# Patient Record
Sex: Female | Born: 1945 | Race: Black or African American | Hispanic: No | Marital: Married | Smoking: Never smoker
Health system: Southern US, Community
[De-identification: ages and names within clinical notes are randomized; demographics above are authoritative.]

## PROBLEM LIST (undated history)

## (undated) DIAGNOSIS — F329 Major depressive disorder, single episode, unspecified: Secondary | ICD-10-CM

## (undated) DIAGNOSIS — F32A Depression, unspecified: Secondary | ICD-10-CM

## (undated) DIAGNOSIS — I639 Cerebral infarction, unspecified: Secondary | ICD-10-CM

## (undated) DIAGNOSIS — I10 Essential (primary) hypertension: Secondary | ICD-10-CM

## (undated) DIAGNOSIS — R040 Epistaxis: Secondary | ICD-10-CM

## (undated) HISTORY — PX: ROTATOR CUFF REPAIR: SHX139

---

## 1980-01-21 HISTORY — PX: OTHER SURGICAL HISTORY: SHX169

## 1989-01-20 HISTORY — PX: ABDOMINAL HYSTERECTOMY: SHX81

## 1996-01-21 HISTORY — PX: INTESTINAL BYPASS: SHX1099

## 1998-04-03 ENCOUNTER — Encounter (HOSPITAL_BASED_OUTPATIENT_CLINIC_OR_DEPARTMENT_OTHER): Payer: Self-pay | Admitting: Internal Medicine

## 1998-04-03 ENCOUNTER — Ambulatory Visit (HOSPITAL_COMMUNITY): Admission: RE | Admit: 1998-04-03 | Discharge: 1998-04-03 | Payer: Self-pay | Admitting: Internal Medicine

## 1999-03-14 ENCOUNTER — Encounter: Payer: Self-pay | Admitting: Internal Medicine

## 1999-03-14 ENCOUNTER — Encounter: Admission: RE | Admit: 1999-03-14 | Discharge: 1999-03-14 | Payer: Self-pay | Admitting: Internal Medicine

## 1999-11-05 ENCOUNTER — Encounter: Payer: Self-pay | Admitting: Emergency Medicine

## 1999-11-05 ENCOUNTER — Emergency Department (HOSPITAL_COMMUNITY): Admission: EM | Admit: 1999-11-05 | Discharge: 1999-11-05 | Payer: Self-pay | Admitting: Emergency Medicine

## 2000-03-06 ENCOUNTER — Inpatient Hospital Stay (HOSPITAL_COMMUNITY): Admission: EM | Admit: 2000-03-06 | Discharge: 2000-03-07 | Payer: Self-pay | Admitting: Emergency Medicine

## 2000-03-06 ENCOUNTER — Encounter: Payer: Self-pay | Admitting: Emergency Medicine

## 2001-04-13 ENCOUNTER — Encounter: Payer: Self-pay | Admitting: Specialist

## 2001-04-15 ENCOUNTER — Observation Stay (HOSPITAL_COMMUNITY): Admission: RE | Admit: 2001-04-15 | Discharge: 2001-04-16 | Payer: Self-pay | Admitting: Specialist

## 2002-10-29 ENCOUNTER — Emergency Department (HOSPITAL_COMMUNITY): Admission: EM | Admit: 2002-10-29 | Discharge: 2002-10-29 | Payer: Self-pay

## 2003-02-07 ENCOUNTER — Encounter (INDEPENDENT_AMBULATORY_CARE_PROVIDER_SITE_OTHER): Payer: Self-pay | Admitting: *Deleted

## 2003-02-07 ENCOUNTER — Ambulatory Visit (HOSPITAL_COMMUNITY): Admission: RE | Admit: 2003-02-07 | Discharge: 2003-02-07 | Payer: Self-pay | Admitting: *Deleted

## 2003-09-05 ENCOUNTER — Emergency Department (HOSPITAL_COMMUNITY): Admission: EM | Admit: 2003-09-05 | Discharge: 2003-09-05 | Payer: Self-pay | Admitting: Emergency Medicine

## 2004-01-21 HISTORY — PX: BACK SURGERY: SHX140

## 2004-08-01 ENCOUNTER — Encounter (INDEPENDENT_AMBULATORY_CARE_PROVIDER_SITE_OTHER): Payer: Self-pay | Admitting: Specialist

## 2004-08-01 ENCOUNTER — Inpatient Hospital Stay (HOSPITAL_COMMUNITY): Admission: RE | Admit: 2004-08-01 | Discharge: 2004-08-04 | Payer: Self-pay | Admitting: Specialist

## 2005-02-07 ENCOUNTER — Encounter: Admission: RE | Admit: 2005-02-07 | Discharge: 2005-02-07 | Payer: Self-pay | Admitting: Internal Medicine

## 2005-03-17 ENCOUNTER — Emergency Department (HOSPITAL_COMMUNITY): Admission: EM | Admit: 2005-03-17 | Discharge: 2005-03-17 | Payer: Self-pay

## 2005-12-15 ENCOUNTER — Ambulatory Visit: Admission: RE | Admit: 2005-12-15 | Discharge: 2005-12-15 | Payer: Self-pay | Admitting: Internal Medicine

## 2007-01-22 ENCOUNTER — Emergency Department (HOSPITAL_COMMUNITY): Admission: EM | Admit: 2007-01-22 | Discharge: 2007-01-23 | Payer: Self-pay | Admitting: Emergency Medicine

## 2007-10-19 ENCOUNTER — Inpatient Hospital Stay (HOSPITAL_COMMUNITY): Admission: EM | Admit: 2007-10-19 | Discharge: 2007-10-22 | Payer: Self-pay | Admitting: Emergency Medicine

## 2007-10-21 ENCOUNTER — Encounter (INDEPENDENT_AMBULATORY_CARE_PROVIDER_SITE_OTHER): Payer: Self-pay | Admitting: General Surgery

## 2008-09-08 ENCOUNTER — Ambulatory Visit (HOSPITAL_COMMUNITY): Admission: RE | Admit: 2008-09-08 | Discharge: 2008-09-09 | Payer: Self-pay | Admitting: Specialist

## 2010-04-27 LAB — BASIC METABOLIC PANEL
CO2: 28 mEq/L (ref 19–32)
Creatinine, Ser: 0.71 mg/dL (ref 0.4–1.2)
GFR calc Af Amer: >60 mL/min (ref >60)
GFR calc non Af Amer: >60 mL/min (ref >60)
Potassium: 4.5 mEq/L (ref 3.5–5.1)
Sodium: 138 mEq/L (ref 135–145)

## 2010-04-27 LAB — CBC
HCT: 36.6 % (ref 36.0–46.0)
MCHC: 31.7 g/dL (ref 30.0–36.0)
MCV: 77.3 fL — ABNORMAL LOW (ref 78.0–100.0)
RBC: 4.74 MIL/uL (ref 3.87–5.11)
RDW: 13.6 % (ref 11.5–15.5)
WBC: 5.3 10*3/uL (ref 4.0–10.5)

## 2010-06-04 NOTE — H&P (Signed)
Sherri Torres, Sherri Torres NO.:  [id number]   MEDICAL RECORD NO.:  [id number]          PATIENT TYPE:  EMS   LOCATION:  ED                           FACILITY:  [facility]   PHYSICIAN:  Angelia Mould. Derrell Lolling, M.D.DATE OF BIRTH:  08-Nov-1945   DATE OF ADMISSION:  10/19/2007  DATE OF DISCHARGE:                              HISTORY & PHYSICAL   CHIEF COMPLAINT:  Abdominal pain and nausea.   HISTORY OF PRESENT ILLNESS:  This is a 65 year old black female who  presented to the emergency room stating that she awoke at 3:00 a.m.  today with moderately severe upper abdominal pain which was steady in  character, did not radiate to the back.  She had nausea and vomiting x2.  She finally came to the emergency room for evaluation.  She says the  pain is essentially gone now.  She has had no prior similar episodes,  but she has been evaluated for chronic constipation and reflux by Dr.  Sabino Gasser.   While in the emergency room, she had a gallbladder ultrasound which  shows multiple gallstones up to 2 cm in diameter and one gallstone  within the gallbladder neck and a thickened gallbladder wall.  Dr.  Weldon Inches called me to see the patient.   PAST HISTORY:  1. Gravida 4, para 5, abortus 0.  2. Hypertension.  3. Constipation.  4. Abdominal hysterectomy in 1991, by Dr. Greta Doom.  5. Laparotomy for small bowel obstruction in 1993, by Dr. Daphine Deutscher.  6. Depression.  7. TIA.  8. Bilateral rotator cuff surgery.  9. Back surgery in 2006.   CURRENT MEDICATIONS:  1. Effexor XR 150 a day.  2. Benicar 40/25 daily.  3. Multivitamins.  4. Tylenol.   DRUG ALLERGIES:  DILANTIN.   SOCIAL HISTORY:  The patient is married.  Her husband works at the post  office.  She is retired from [employer] and [employer].  She denies the use of  alcohol or tobacco.  She had five children, but she lost one to an auto  pedestrian accident, two children were lost to AIDS and one was lost to  multiple myeloma.  She  attributes this to her depression.   FAMILY HISTORY:  Mother died of ovarian cancer.  Father died of throat  cancer.   REVIEW OF SYSTEMS:  A 16 system review of systems is performed and is  noncontributory except as described above.   PHYSICAL EXAMINATION:  GENERAL:  A pleasant, cooperative black female in  minimal distress.  VITAL SIGNS:  Temperature 99.3, blood pressure 170/85, repeat 134/77,  pulse 69, respirations 20.  HEENT:  Eyes; sclerae clear.  Extraocular  movements intact.  Ears, nose, mouth, throat, nose, lips, tongue and  oropharynx are without gross lesions.  NECK:  Supple, nontender.  No mass.  No jugulovenous distention.  LUNGS:  Clear to auscultation.  No costovertebral angle tenderness.  HEART:  Regular rate and rhythm.  She does have a grade 2/6 systolic  murmur and perhaps a split S2.  BREASTS:  Not examined.  ABDOMEN:  Soft.  Mild tenderness to  deep palpation in the right upper  quadrant.  No mass.  Liver and spleen not enlarged.  Lower midline scar  well healed.  Abdomen nontender elsewhere.  EXTREMITIES:  She moves all  four extremities well without pain or deformity.  NEUROLOGIC:  No gross motor or sensory deficits.   ADMISSION DATA:  Ultrasound as described above documenting gallbladder  wall thickening and gallstones.  White blood cell count 11,000,  hemoglobin 11.8.  Liver function tests normal.  Lipase 17.   ASSESSMENT:  1. Acute cholecystitis with cholelithiasis.  She probably has a      resolving attack at this time.  2. Hypertension.  3. Status post total abdominal hysterectomy.  4. Status post laparotomy for small-bowel obstruction.  5. Depression.  6. History of transient ischemic attack.   PLAN:  1. The patient wanted to be admitted and have her gallbladder surgery      at this time, so we are going to admit her, start her on IV      antibiotics, and plan a laparoscopic cholecystectomy with      cholangiogram in the next 24-48 hours.  2. I  have discussed the indication in details of surgery with her.      The risks and complications have been outlined, including, but not      limited to bleeding, infection, conversion to open laparotomy,      injury to adjacent structures, such as the main bile duct or      intestine with major reconstructive surgery, wound problems such as      infection or hernia, bile leak, cardiac, pulmonary and      thromboembolic problems.  She seems to understand these issues      well.  At this time, all of her questions were answered.  She is in      full agreement with this plan.      Angelia Mould. Derrell Lolling, M.D.  Electronically Signed     HMI/MEDQ  D:  10/19/2007  T:  10/20/2007  Job:  [id number]   cc:   Soyla Murphy. Renne Crigler, M.D.  Fax: [phone]

## 2010-06-04 NOTE — Op Note (Signed)
NAMEAAMNA, MALLOZZI NO.:  [id number]   MEDICAL RECORD NO.:  [id number]          PATIENT TYPE:  INP   LOCATION:  1338                         FACILITY:  [facility]   PHYSICIAN:  Adolph Pollack, M.D.DATE OF BIRTH:  11-12-1945   DATE OF PROCEDURE:  10/21/2007  DATE OF DISCHARGE:                               OPERATIVE REPORT   PREOPERATIVE DIAGNOSIS:  Acute cholecystitis.   POSTOPERATIVE DIAGNOSIS:  Acute cholecystitis.   PROCEDURE:  Laparoscopic cholecystectomy, intraoperative cholangiogram.   SURGEON:  Adolph Pollack, M.D.   ASSISTANT:  Darnell Level, M.D.   ANESTHESIA:  General.   INDICATIONS:  This 65 year old female was admitted very late October 19, 2007, with acute cholecystitis.  She had been hydrated, placed on  antibiotics, and now brought to the operating room for laparoscopic  possible open cholecystectomy.  The procedure risks were discussed with  her preoperatively.   TECHNIQUE:  She was brought to the operating room, placed supine on the  operating room table, and general anesthetic was administered.  The  abdominal wall was sterilely prepped and draped.  She had a previous  lower midline incision.  Marcaine was infiltrated in the supraumbilical  area.  A supraumbilical small transverse incision was made through the  skin and subcutaneous tissue exposing the midline fascia.  A small  incision was made in the midline fascia, and the peritoneal cavity was  entered under direct vision.  A pursestring suture of 0 Vicryl placed  around the fascial edges.  A Hassan trocar was introduced into the  peritoneal cavity and pneumoperitoneum created by insufflation of CO2  gas.   Following this, the laparoscope was introduced.  She was placed in  reversed Trendelenburg position, the right side tilted slightly up.  An  11-mm trocar was placed through an epigastric incision and two 5-mm  trocars placed in right upper quadrant.  The gallbladder was  acutely  inflamed and edematous with some very thin adhesions between the omentum  and gallbladder which were taken down easily.  Needle decompression was  then performed of the gallbladder, evacuating dark fluid.  The fundus  was then of the gallbladder was then grasped and retracted toward the  right shoulder.  The infundibulum was retracted laterally and mobilized  using blunt dissection.  I then identified the cystic duct and an  anterior branch of the cystic artery running anterior to the cystic  duct.  I created a window around the anterior branch of the cystic  artery, and I clipped and divided it, better exposing the cystic duct.  I then created a window around the cystic duct and placed a clip at the  cystic duct/gallbladder junction.  Of note was that a large gallstone  was impacted in the neck of the gallbladder.   A small incision was made in the cystic duct.  A cholangiocatheter was  passed through the anterior abdominal wall, placed into the cystic duct,  and a cholangiogram was performed.   Under real-time fluoroscopy, dilute contrast was injected into the  cystic duct which was of short to moderate  length.  Common hepatic,  right and left hepatic, and common bile ducts all filled promptly.  Contrast drained into the duodenum without obvious evidence of  obstruction.  Final reports pending the radiologist's interpretation.   Following this, I removed the cholangiocatheter.  The cystic duct was  then clipped 3 times on the biliary side and divided.  I then identified  a posterior branch of the cystic artery and created a window around it.  It was clipped and divided.  The gallbladder was then dissected free  from liver using electrocautery and placed in Endopouch bag.  I then  removed the gallbladder through the supraumbilical incision by slightly  enlarging the fascial and skin incisions.   Following this, I inspected the gallbladder fossa and copiously  irrigated out  the gallbladder fossa and perihepatic area with fluid and  controlled bleeding with electrocautery.  Upon further inspection, there  is no bleeding noted.  No bile leak was noted.  The irrigation fluid was  evacuated as much as possible.   Following this, I then closed the supraumbilical fascial defect by  tightening up and tying down the pursestring suture and adding a simple  0 Vicryl suture.  This was done under laparoscopic vision.  The  remaining trocars were removed, and pneumoperitoneum was released.  Skin  incisions were closed with 4-0 Monocryl subcuticular stitches followed  by Steri-Strips and sterile dressings.  She tolerated the procedure well  without apparent complications and was taken to recovery in satisfactory  condition.      Adolph Pollack, M.D.  Electronically Signed     TJR/MEDQ  D:  10/21/2007  T:  10/22/2007  Job:  [id number]   cc:   Soyla Murphy. Renne Crigler, M.D.  Fax: [phone]

## 2010-06-04 NOTE — Op Note (Signed)
Sherri Torres, Sherri Torres                 ACCOUNT NO.:  [id number]   MEDICAL RECORD NO.:  [id number]          PATIENT TYPE:  OIB   LOCATION:  1606                         FACILITY:  [facility]   PHYSICIAN:  Jene Every, M.D.    DATE OF BIRTH:  1945/02/12   DATE OF PROCEDURE:  09/08/2008  DATE OF DISCHARGE:                               OPERATIVE REPORT   PREOPERATIVE DIAGNOSES:  Recurrent rotator cuff tear, right shoulder.   POSTOPERATIVE DIAGNOSES:  Recurrent rotator cuff tear, right shoulder.   PROCEDURE PERFORMED:  1. Redo of rotator cuff repair.  2. Bursectomy.  3. Subacromial decompression.   BRIEF HISTORY/INDICATIONS:  This 65 year old with recurrent rotator cuff  tear, indicated for repair.  The risks and benefits were discussed  including bleeding, infection, suboptimal range of motion, recurrent  tear, need for revision, inability to repair it, DVT, PE and the  anesthetic complications etc.   DESCRIPTION OF PROCEDURE:  With the patient in the supine beach-chair  position after the induction of adequate anesthesia and 1 gram of  Kefzol.  The right shoulder and upper extremity were prepped and draped  in the usual sterile fashion.  A small 2 cm incision.  The pre-existing  scar was utilized and the scar was excised.  The subcutaneous tissue was  dissected.  Electrocautery utilized to achieve hemostasis.  It was  dissected off the anterolateral aspect of the acromion, limiting the  extent of the incision to 2 cm over the lateral aspect of the acromion.  The raphe between the anterolateral heads was identified and was still  needed.  We divided that and subperiosteally elevated the deltoid,  preserving its attachment from the anterior lateral aspect of the  acromion.  The CA ligament had been divided.  A previous acromioplasty  was performed.  This was slightly shaved and debrided.  Clear synovial  fluid was evacuated.  The extensive bursa was digitally lysed and  excised.  A small  full-thickness tear was noted in the supraspinatus.  Curetted just beneath that with good bleeding tissue, and placed a Mitek  suture anchor.  There was good pullout in the good bone, and repaired  the tendon side-to-side into the bleeding bone with the good surgeon's  knot, and then over-tied it with O Vicryl simple sutures.  The remainder  of the tendon appeared to be intact, although attenuated.  We copiously  irrigated the wound.  No significant spurring was noted.   The wound was copiously irrigated.  The raphe repaired with #1 Vicryl  interrupted figure-of-eight sutures.  The subcu with #2-0 Vicryl simple  sutures.  The skin was reapproximated with subcuticular Prolene.  The  wound was reinforced with Steri-Strips.  Sterile dressing applied.  Placed supine on the abduction pillow with a sling applied.   She was extubated and transported to the recovery room in satisfactory  condition.  There were no complications.     Jene Every, M.D.  Electronically Signed    JB/MEDQ  D:  09/08/2008  T:  09/08/2008  Job:  [id number]

## 2010-06-07 NOTE — H&P (Signed)
Sherri Torres, Sherri Torres NO.:  [id number]   MEDICAL RECORD NO.:  [id number]          PATIENT TYPE:  INP   LOCATION:  1619                         FACILITY:  [facility]   PHYSICIAN:  Jene Every, M.D.    DATE OF BIRTH:  1945-07-16   DATE OF ADMISSION:  08/01/2004  DATE OF DISCHARGE:  08/04/2004                                HISTORY & PHYSICAL   CHIEF COMPLAINT:  Lower extremity pain, right greater than left.   HISTORY:  Sherri Torres is a 65 year old female with refractory right lower  extremity pain from the L5 nerve root distribution. The patient did undergo  conservative treatment with failure. MRI did indicate central disc  protrusion at L4-5. Physical findings do show positive neurotension signs,  EHL weakness, with very minor left-sided symptoms noted, so it was decided  the patient would benefit from a lumbar decompression at the L4-5 level. The  risks and benefits of the surgery were discussed with the patient.  Therefore, she wishes to proceed.   MEDICAL HISTORY:  Significant for:  1.  Hypertension.  2.  Depression.   CURRENT MEDICATIONS:  1.  Uretic 15/12.5 one p.o. q.a.m.  2.  Darovcet-N 100 one p.o. p.r.n. pain.  3.  Effexor XR 150 mg one p.o. q.a.m.  4.  Multivitamin.   ALLERGIES:  DILANTIN which causes a rash.   SOCIAL HISTORY:  The patient denies any alcohol consumption, no tobacco use.  She is married.   PREVIOUS SURGERIES:  Include history of head injury in 1982, hysterectomy,  intestinal blockage in 1993, left rotator cuff repair.   PRIMARY CARE PHYSICIAN:  Dr. Renne Crigler.   REVIEW OF SYSTEMS:  The patient denies any fever, chills, night sweats, or  bleeding tendencies. CNS:  No blurred or double vision, seizure, headache,  or paralysis. RESPIRATORY:  No shortness of breath, productive cough, or  hemoptysis. CARDIOVASCULAR:  No chest pain, angina, or orthopnea. GU:  No  dysuria, hematuria, or discharge. GI:  No nausea, vomiting, diarrhea,  constipation, or bloody stools. MUSCULOSKELETAL:  As pertinent to HPI.   PHYSICAL EXAMINATION:  Taken from admission health history sheet.  VITAL SIGNS:  Temperature is 98.5, heart rate is 90, respiratory rate 18,  blood pressure 150/80.  GENERAL:  This is a well-developed, well-nourished female in mild distress.  HEENT:  Atraumatic, normocephalic. Pupils equal, round, and reactive to  light. EOMs intact.  NECK:  Supple. No lymphadenopathy.  CHEST:  Clear to auscultation bilaterally. No rhonchi, wheezes, or rales.  HEART:  Regular rate and rhythm without murmurs, gallops, or rubs.  ABDOMEN:  Soft, nontender, nondistended, bowel sounds x4.  BREAST/GENITOURINARY:  Not examined, not pertinent to HPI.  EXTREMITIES:  The patient has a positive straight leg raise on the right.  EHL is 5-/5 on the right. Deep pedal pulses are equal. Calves soft,  nontender.   IMPRESSION:  Spinal stenosis L4-5, right greater than left.   PLAN:  The patient will be admitted to undergo lumbar decompression at L4-5  level on the right.       CS/MEDQ  D:  08/07/2004  T:  08/07/2004  Job:  [id number]

## 2010-06-07 NOTE — H&P (Signed)
[facility]  Patient:    Sherri Torres, Sherri Torres                        MRN: [id number] Adm. Date:  [id number] Attending:  Londell Moh                         History and Physical  HISTORY OF PRESENT ILLNESS:  Chardae is a 65 year old African-American resident of [facility] admitted with a chief complaint of chest pain.  She stated that this started actually three days ago.  It has been nearly constant since that time.  It is in the left upper anterior chest region and radiates at times to her left arm and left neck.  At times, she feels a little bit short of breath with this.  She has had some intermittent nausea.  There are no symptoms of sour taste washing back up into her throat.  She had had a prior cardiac workup by Dr. Madaline Savage in 1997 showing normal coronary arteries at that time.  She states she feels well otherwise.  She has had no fevers or chills.  She is not short of breath at this time.  PAST HISTORY:  Notable for chronic constipation and hypertension.  CURRENT MEDICATIONS  1. Norvasc 5 mg p.o. daily.  2. Premarin 0.625 mg p.o. daily.  3. Caltrate one b.i.d.  4. Vitamin E daily.  5. Cod liver oil daily.  6. Paxil 20 mg daily.  7. Aspirin 81 mg p.o. daily.  ALLERGIES:  DILANTIN -- rash.  PERSONAL HISTORY:  Originally from [place].  She has 12 years of education.  Does not use ethanol or tobacco.  She works in the ______ and is helping to raise three of her grandchildren.  FAMILY HISTORY:  Mother died of ovarian cancer, father of throat cancer. Daughter died of myeloma.  She has one son, alive and well.  REVIEW OF SYSTEMS:  She has had no weight change or definite temperature intolerance.  There are no skin changes or lumps or bumps.  She has had no recent trauma or injuries or stress.  Sleep is okay.  She has some numbness in the middle fingers of her left hand only; otherwise, no numbness or tingling. She has no muscle  weakness, seizures, syncope or dizziness.  She has a history of headaches and had seen Dr. Epimenio Foot in [facility] about this and had had a recent MRI, of which she tells me showed a couple of mini-strokes; I do not have access to these records at this time.  She has no changes in hearing or vision, no difficulty in swallowing, no heartburn.  She has a little bit of upper abdominal feelings of nausea but no abdominal pain, diarrhea, constipation or change in her stools.  There is no melena or hematochezia. She denies any breast discharge or masses.  No vaginal discharge or bleeding are noted.  No dysuria, urgency, frequency or hematuria are noted.  No joint or muscle aches.  No swelling.  PHYSICAL EXAMINATION  GENERAL:  She is comfortable appearing, slightly overweight, in no acute distress.  VITAL SIGNS:  Temperature is 98.8, pulse 81 and respirations 20.  BP is 169/70 initially.  EKG:  Normal sinus rhythm, within normal limits.  SKIN:  Also within normal limits.  LYMPHATICS:  There is cervical, supraclavicular, axillary or inguinal adenopathy.  HEENT:  No papilledema is noted.  Her TMs  are normal.  Normocephalic. Atraumatic.  Conjunctivae and pupils appear normal.  Normal tongue and posterior pharynx are noted.  Normal mucous membranes.  NECK:  Supple.  No adenopathy.  No neck masses.  No carotid bruits.  CARDIAC:  With 2+ carotid, radial and posterior tibial and 1+ dorsalis pedis pulses.  Regular rate and rhythm.  Normal S1 and S2.  No murmurs, rubs, nor gallops  ABDOMEN:  Nontender.  Bowel sounds are present.  No organomegaly.  No masses.  LUNGS:  Clear to auscultation.  NEUROLOGIC:  Equal strength in EHL, knee flexors and extensors and hip flexors bilaterally, with 1+ equal deep tendon reflexes in knees.  Normal speech.  She is alert with cranial nerves II-XII intact and fundi show no papilledema. Gait is not tested at this time.  IMPRESSION  1. Chest pain, atypical.  I  am not sure if this may be related to panic,     musculoskeletal, gastroesophageal reflux.  Empirically put on Protonix.     Consultation requested with Dr. Aram Candela. Tysinger of cardiology for     suggestions about further workup.  None of her laboratories are back at     this time.  Chest x-ray is negative and electrocardiogram is unremarkable.  2. History of headaches, possible history of mini-strokes -- seeing     Dr. Epimenio Foot in [facility].  3. Hypertension.  4. History of chronic constipation.  5. Status post partial hysterectomy in 1991.  6. History of balloon replacement, left cavernous sinus, at [facility].  7. Allergy:  Dilantin -- itching.  8. History of depression -- on Paxil.  9. History of possible cerebrovascular accident. 10. History of adhesions. 11. Family history of diabetes and ovarian cancer. 12. Chronic intermittent left arm paresthesias, question of carpal tunnel     syndrome. DD:  03/06/00 TD:  03/07/00 Job: [id number]

## 2010-06-07 NOTE — Op Note (Signed)
NAMESHEKINA, Sherri Torres                 ACCOUNT NO.:  [id number]   MEDICAL RECORD NO.:  [id number]          PATIENT TYPE:  AMB   LOCATION:  DAY                          FACILITY:  [facility]   PHYSICIAN:  Jene Every, M.D.    DATE OF BIRTH:  1945-02-23   DATE OF PROCEDURE:  08/01/2004  DATE OF DISCHARGE:                                 OPERATIVE REPORT   PREOPERATIVE DIAGNOSES:  Spinal stenosis, lateral recess stenosis L4-5  right.   POSTOPERATIVE DIAGNOSES:  Spinal stenosis, lateral recess stenosis L4-5  right, herniated nucleus pulposus with small extruded fragment L4-5 right.  Attenuated dura.   PROCEDURE:  Lateral recess decompression and foraminotomy at L4-5 right,  microdiskectomy L4-5 right, application of Duragen to dural rent on the  right.   ANESTHESIA:  General.   SURGEON:  Jene Every, M.D.   ASSISTANT:  Roma Schanz, P.A.   BRIEF HISTORY:  This 65 year old female with refractory right lower  extremity radicular pain the L5 nerve root distribution. The patient  demonstrated positive neural tension signs, EHL weakness, had some minor  left sided symptoms. A MRI indicated a central disk protrusion at 4-5. There  was a small protrusion paracentral to the left. The patient had no L4  symptoms on the left, she had L5 symptoms predominantly on the right.  Minimal symptoms on the left. The patient had been treated with conservative  treatments. She had an increase in lumbosacral angle. It was felt that the  patient demonstrated some dominant neural compression. She had on the  parasagittal images at 4-5 a lateral recess stenosis secondary to disk  protrusion as well as facet and ligamentum flavum hypertrophy. We discussed  lateral recess decompression with  decompression of the L5 nerve root. There  was a possibility of decompression on the left if no significant pathology  was encountered on the right. The risks and benefits of the procedure were  discussed including  bleeding, infection, injury to neurovascular structures,  CSF leakage, epidural fibrosis, adjacent segment disease and need for fusion  in the future, no change in symptoms, worsening symptoms, anesthetic  complications, etc. The patient declined epidural steroid injections for  diagnostic and therapeutic purposes. Given her clinical symptoms, L5 nerve  root n the region noted on the MRI, we proceeded accordingly.   TECHNIQUE:  The patient in the supine position and after the induction of  adequate general anesthesia, 1 g of Kefzol, she was placed prone on the  [place] frame. All bony prominences well padded, noted was an increased  lumbosacral angle. The lumbar region was prepped and draped in the usual  sterile fashion. Two 18 gauge spinal needles were utilized to localize the 4-  5 interspace confirmed with x-ray. An incision was made from the spinous  process of 4-5, subcutaneous tissue was dissected, electrocautery was  utilized to achieve hemostasis. The dorsolumbar fascia identified and  divided in line with the skin incision. The paraspinous muscle elevated from  the lamina of 4 and 5. We initially performed this bilaterally. Attention  was first turned towards the right. A McCullough retractor  was placed and a  Penfield 4 in the intralaminar space at 4-5 confirmed with x-ray.  Hemilaminotomy of the caudad edge of 4 was performed with a 2 and a 3 mm  Kerrison. We supplemented this with a high speed bur. The ligamentum flavum  was intact. Hypertrophic ligamentum flavum was noted. We decompressed out  laterally with a 2 mm Kerrison. From the facet, the superior articulating  process of 5. At its undersurface, there was a small osteophyte projecting  to the thecal sac just beneath the pedicle. After ligamentum flavum detached  from the caudad edge of 4, cephalad edge of 5 and a foraminotomy was  performed, we mobilized the thecal sac medially and removed the bone spur.  Noted was a  small 1.5 mm dural rent. There was no significant leakage noted  at that time. There was a hypervascular venous plexus tear in the root as  well. A small disk protrusion at the disk migrated somewhat caudad. This was  compressing the L5 nerve root significantly in the lateral recess. With a  patty placed over the region of the rent, we decompressed the lateral recess  to the medial border of the pedicle with a 2 mm Kerrison. Cauterized and  lysed the venous plexus mobilizing the root. With a focal HNP noted, I  performed and annulotomy and removed a focal disk herniation from the  subannular space with multiple passes with a straight pituitary, nerve hook  and an upbiting pituitary. This was felt to decompress the thecal sac and  out laterally as efficiently. I left remaining a flap of ligamentum flavum  for coverage over the small rent. This was from the medial aspect of the  intralaminar space. The disk space was copiously irrigated with antibiotic  irrigation. There was a fair amount of oozing from the cancellous surfaces,  we therefore used bone wax for that hemostasis. The venous plexus noted was  extensive and was felt to be contributing to this stenotic lesion in the  lateral recess. I felt this small rent was amendable to a Duragen patch  given it was fairly small. I placed a Duragen patch in the appropriate  orientation over the rent and then the ligamentum flavum flap was lifted up  prior to this, the Duragen patch placed beneath this and the ligamentum  flavum draped across that. This appropriately covered the area of the thecal  sac also placed a small patch of that proximal and distal. That combined  with a thrombin soaked Gelfoam distally. The Valsalva maneuver was performed  with no evidence of any CSF leakage. I then placed thrombin soaked Gelfoam  above and below. Just prior to that, I placed a hockey stick probe out in the foramen of 4 and at 5 and beneath the thecal sac  out medially and felt  that the diskectomy had decompressed the thecal sac. It was felt then that  given the multifactorial stenosis into this lateral recess, the L5 nerve  root was decompressed and it was what was the etiology of a radiculopathy.  Felt best then to not enter the intralaminar space on the left given her  minimal symptomatology on that left hand side.   Next, I removed the McCullough retractor, paraspinous muscles no evidence of  active bleeding. We irrigated the intralaminar space and closed the  dorsolumbar fascia with #1 Vicryl interrupted figure-of-eight sutures and I  overset it with an 0 Vicryl running suture for water tight closure. The  subcutaneous tissue was  reapproximated with 2-0 simple suture, skin was  reapproximated with 4-0 subcuticular Prolene. The wound was reinforced with  Steri-Strips, sterile dressing applied. She was placed supine on the  hospital bed, extubated without difficulty and transported to the recovery  room in satisfactory condition. She was placed carefully to a supine  position without undue Valsalva. 50 mL of blood loss.       JB/MEDQ  D:  08/01/2004  T:  08/01/2004  Job:  [id number]

## 2010-06-07 NOTE — Op Note (Signed)
NAME:  Sherri Torres, Sherri Torres                           ACCOUNT NO.:  [id number]   MEDICAL RECORD NO.:  [id number]                   PATIENT TYPE:  AMB   LOCATION:  ENDO                                 FACILITY:  [facility]   PHYSICIAN:  Georgiana Spinner, M.D.                 DATE OF BIRTH:  January 19, 1946   DATE OF PROCEDURE:  02/07/2003  DATE OF DISCHARGE:                                 OPERATIVE REPORT   PROCEDURE:  Upper endoscopy.   INDICATIONS FOR PROCEDURE:  Abdominal discomfort, gastroesophageal reflux  disease.   ANESTHESIA:  Demerol 70, Versed 7 mg.   PROCEDURE:  With the patient mildly sedated in the left lateral decubitus  position the Olympus videoscopic endoscope was inserted into the mouth and  passed under direct vision through the esophagus which appeared  normal.  There was no evidence of Barrett's esophagus.   We entered into the stomach. The fundus body, antrum were visualized. The  duodenal bulb and 2nd portion of the duodenum were visualized. From this  point the endoscope was slowly withdrawn, taking circumferential  views of  the duodenal mucosa until the endoscope had been pulled back into the  stomach. It was placed in retroflexion to view the stomach  from below. The  endoscope was straightened and withdrawn taking circumferential views of the  remaining gastric and esophageal mucosa, stopping  in the antrum, where  changes of erythema in a measles like distribution were seen in the antrum.  These were photographed and biopsied.   The patient's vital signs and pulse oximeter remained stable. The patient  tolerated the procedure well without apparent complications.   FINDINGS:  Erythema of the antrum of the stomach, biopsied.   PLAN:  Await biopsy report. The patient will call me for results and follow  up with me as an outpatient. Proceed to colonoscopy.                                               Georgiana Spinner, M.D.    GMO/MEDQ  D:  02/07/2003  T:  02/07/2003   Job:  [id number]

## 2010-06-07 NOTE — Op Note (Signed)
Sherri Torres, Sherri Torres                 ACCOUNT NO.:  [id number]   MEDICAL RECORD NO.:  [id number]          PATIENT TYPE:  AMB   LOCATION:  DAY                          FACILITY:  [facility]   PHYSICIAN:  Jene Every, M.D.    DATE OF BIRTH:  1945-09-19   DATE OF PROCEDURE:  DATE OF DISCHARGE:                                 OPERATIVE REPORT   Audio too short to transcribe (less than 5 seconds)       JB/MEDQ  D:  08/01/2004  T:  08/01/2004  Job:  [id number]

## 2010-06-07 NOTE — Op Note (Signed)
[facility]  Patient:    Sherri Torres, Sherri Torres Visit Number: [id number] MRN: [id number]          Service Type: SUR Location: 4W 0457 01 Attending Physician:  Rocky Crafts Dictated by:   Michael Litter. Montez Morita, M.D. Proc. Date: 04/15/01 Admit Date:  04/15/2001 Discharge Date: 04/16/2001                             Operative Report  SURGEON:  Philips J. Montez Morita, M.D.  ASSISTANT:  Marcie Bal. Troncale, P.A.C.  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  POSTOPERATIVE DIAGNOSES:  Impingement syndrome with acromioclavicular arthritis.  OPERATION PERFORMED:  Distal clavicle excision, anterior acromioplasty, rotator cuff evaluation and inspection.  DESCRIPTION OF PROCEDURE:  After suitable general anesthesia, she was positioned on the shoulder frame, prepped and draped routinely. An anterior incision is made over the distal clavicle in the anterior acromion and about an inch or so of the deltoid. The distal clavicle has been freed up subperiosteally anteriorly and posteriorly and the deltoid and coracoacromial ligament are taken off of the anterior acromion. No free fluid exudes from the subacromial space as would be anticipated with a tear. The distal clavicle is excised with a saw and is treated with bone wax and the area is bovied and dry and the anterior acromion is then beveled back with a protecting large Cobb elevator beneath with the saw. Thick bursa is removed and the cuff is inspected and it does not appear to have a significant tear one into the other. The deltoid having been split about an inch in the direction of its fibers. Following this, the coracoacromial ligament is then cut transversely just above the detachment to the coracoid. Three drill holes are placed in the acromion. Two sutures are placed in the periosteum and the muscle tissue of the anterior and posterior  distal clavicle. A piece of Gelfoam soaked in Xylocaine is placed in the [facility] joint. Both sutures are tied. Three suture holes through the acromion are then passed through the fascia was released. The ______ of the coracoacromial ligament and the deltoid muscle, all of these with #1 PDS #2 suture. Pulled back and approximated then two sutures in the fascia of the deltoid over the 1 inch split with the same material. Some 2-0 coated Vicryl in the subcu, running monocryl 3-0 in the skin with Steri-Strips. Compression dressing and a sling. She goes to recovery in good condition. Dictated by:   [employer]. Montez Morita, M.D. Attending Physician:  Rocky Crafts DD:  04/15/01 TD:  04/16/01 Job: [id number]

## 2010-06-07 NOTE — Discharge Summary (Signed)
NAMESHYNE, RESCH NO.:  [id number]   MEDICAL RECORD NO.:  [id number]          PATIENT TYPE:  INP   LOCATION:  1619                         FACILITY:  [facility]   PHYSICIAN:  Jene Every, M.D.    DATE OF BIRTH:  06-Dec-1945   DATE OF ADMISSION:  08/01/2004  DATE OF DISCHARGE:  08/04/2004                                 DISCHARGE SUMMARY   ADMISSION DIAGNOSES:  1.  Spinal stenosis L4-5.  2.  Hypertension.  3.  Depression.   DISCHARGE DIAGNOSES:  1.  Spinal stenosis L4-5.  2.  Hypertension.  3.  Depression.  4.  Status post lumbar decompression L4-5 on the right with dural bleb.   CONSULTS:  PT/OT.   PROCEDURE:  The patient was taken to the OR on August 01, 2004 to undergo  lumbar decompression L4-5 bilaterally. Dural bleb was obtained. This was  patched. The right side was decompressed. The left side was evaluated and  felt to be stable. Anesthesia:  General. Surgeon:  Dr. Jene Every.  Assistant:  Roma Schanz, P.A.-C.   HISTORY:  Ms. Sherri Torres is a 65 year old female with intractable lower  extremity pain. The patient had failed conservative treatment, declined  epidural steroid injections. She had positive neurotension signs on exam  with spinal stenosis demonstrated on MRI studies, felt at this time she  would benefit from lumbar decompression. Risks and benefits were discussed  with the patient and she decided to proceed.   LABORATORY DATA:  Preoperative laboratory showed a hemoglobin of 11.3,  hematocrit 35.4. Routine postoperative CBC showed white cell count of 10.7,  hemoglobin 10.3, hematocrit 31.2. Routine chemistries done preoperatively  showed a sodium of 140, potassium 4.3, slightly elevated glucose of 143, a  BUN and creatinine of 13 and 0.8. I do not see a preoperative EKG or chest x-  ray on the chart.   HOSPITAL COURSE:  The patient was admitted, taken to the OR, underwent the  above-stated procedure. She did have a dural tear  intraoperatively which was  treated with application of DuraGen. The patient was transferred to the PACU  in a supine position without difficulty. Foley was placed at time of  surgery. Postoperatively, the patient did very well. She had no headache.  Postoperative day #1 it was allowed for her to move the head of bed to 30  degrees and to increase her activity. Postoperative day #2 Foley was  discontinued. The patient was out of bed with physical therapy without any  issues. No headache was noted. She did note improvement in her lower  extremity pain. Discharge planning was initiated. Postoperative day #3 the  patient was doing very well, no nausea, denied headache. She was passing  flatus and was voiding without difficulty. Pain was well controlled on p.o.  analgesics. Motor and neurovascular function was intact. Wound was clean,  dry, and intact without evidence of infection.   DISPOSITION:  The patient discharged home.   OFFICE VISIT:  Follow up with Dr. Shelle Iron in approximately 10-14 days for a  repeat evaluation. She is to call for an  appointment.   WOUND CARE:  Change daily. It is okay for her shower in 72 hours.   ACTIVITY:  She is to walk as tolerated. No bending, twisting, stooping, or  lifting. No driving for 2 weeks.   DISCHARGE MEDICATIONS:  1.  Vicodin one to two p.o. q.4-6h. p.r.n. pain.  2.  Robaxin one p.o. q.6-8h. p.r.n. spasm.  3.  Stool softener or laxative p.r.n.  4.  Ice to back p.r.n.   DIET:  As tolerated.   SPECIAL INSTRUCTIONS:  The patient is to call if she has persistent headache  with going to the upright position or she develops any fever or chills.   CONDITION ON DISCHARGE:  Stable.   FINAL DIAGNOSIS:  Status post decompression L4-5 on the right.       CS/MEDQ  D:  08/07/2004  T:  08/07/2004  Job:  [id number]
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
## 2010-06-07 NOTE — Op Note (Signed)
NAME:  Sherri Torres, Sherri Torres                           ACCOUNT NO.:  [id number]   MEDICAL RECORD NO.:  [id number]                   PATIENT TYPE:  AMB   LOCATION:  ENDO                                 FACILITY:  [facility]   PHYSICIAN:  Georgiana Spinner, M.D.                 DATE OF BIRTH:  [birth year]   DATE OF PROCEDURE:  02/07/2003  DATE OF DISCHARGE:                                 OPERATIVE REPORT   PROCEDURE:  Colonoscopy.   INDICATIONS FOR PROCEDURE:  Colon cancer screening.   ANESTHESIA:  Demerol 20, Versed 2 mg.   PROCEDURE:  With the patient mildly sedated in the left lateral decubitus  position, the Olympus videoscopic colonoscope was inserted in the rectum and  passed under direct vision to the cecum identified by the ileocecal valve  and appendiceal orifice, both of which were photographed.  From this point,  the colonoscope was slowly withdrawn taking circumferential views of the  colonic mucosa stopping only in the rectum which appeared normal.  The  rectum showed hemorrhoids on retroflex view.  The endoscope was straightened  and withdrawn.  The patient's vital signs and pulse oximeter remained  stable.  The patient tolerated the procedure well without apparent  complications.   FINDINGS:  Internal hemorrhoids, otherwise, unremarkable colonoscopic  examination.   PLAN:  See endoscopy note for further details.                                               Georgiana Spinner, M.D.    GMO/MEDQ  D:  02/07/2003  T:  02/07/2003  Job:  [id number]

## 2010-06-07 NOTE — Discharge Summary (Signed)
Sherri Torres, Sherri Torres                 ACCOUNT NO.:  [id number]   MEDICAL RECORD NO.:  [id number]          PATIENT TYPE:  INP   LOCATION:  1338                         FACILITY:  [facility]   PHYSICIAN:  Adolph Pollack, M.D.DATE OF BIRTH:  [birth year]   DATE OF ADMISSION:  10/19/2007  DATE OF DISCHARGE:  10/22/2007                               DISCHARGE SUMMARY   FINAL DIAGNOSIS:  Acute cholecystitis.   SECONDARY DIAGNOSES:  1. Hypertension.  2. Depression.  3. Transient ischemic attack.   PROCEDURE:  Laparoscopic cholecystectomy with intraoperative  cholangiogram October 21, 2007.   REASON FOR ADMISSION:  This is a 65 year old female who presented to the  emergency room with severe upper abdominal pain and was noted to have  acute cholecystitis.  She was admitted by Dr. Claud Kelp and placed  on IV antibiotics.   HOSPITAL COURSE:  She was placed on intravenous antibiotics, and OR time  was available October 21, 2007.  She was then taken to the operating room  for the above procedure which she tolerated well.  Postoperatively, she  was sore but felt better overall, was doing well, tolerating her diet  and able to be discharged.   DISPOSITION:  She was discharged to home on October 22, 2007, and was in  satisfactory condition.  She was given a discharge instruction sheet.  She will follow up with me in the office in approximately 2 weeks.  I  gave her a prescription for antibiotics to continue taking for a total  of 5-7 days and Vicodin for pain.  She was going to resume all of her  home medications as well.      Adolph Pollack, M.D.  Electronically Signed     TJR/MEDQ  D:  11/10/2007  T:  11/10/2007  Job:  [id number]   cc:   Soyla Murphy. Renne Crigler, M.D.  Fax: [phone]

## 2010-06-07 NOTE — Discharge Summary (Signed)
[facility]  Patient:    Sherri Torres, Sherri Torres                        MRN: [id number] Adm. Date:  [id number] Disc. Date: [id number] Attending:  Londell Moh                           Discharge Summary  DATE OF BIRTH:  03-14-1945  FINAL DIAGNOSES: 1. Acute chest pain, atypical, probably not coronary. 2. Hypertension.  HISTORY OF PRESENT ILLNESS:  This 65 year old black lady was admitted by Dr. Merri Brunette with complaints of sharp severe chest pain associated with shallow breathing.  She had a similar episode of chest pain in 1997, and wound up getting a cardiac catheterization by Dr. Amil Amen.  She was found to have normal coronary arteries and normal left ventricular function at that time. On the current admission her EKG is normal, and her initial cardial enzymes were normal.  However, it was felt that she should be admitted for further evaluation and serial enzymes.  HOSPITAL COURSE:  The patient was admitted to the telemetry floor and monitored expectantly.  She was placed on Lovenox for embolus prophylaxis. She had serial EKGs and enzymes which did not show any acute changes.  Her CKs remained within normal limits and her troponin remained normal.  The morning after admission, the patient was quite comfortable with having no pain, and it was felt that she could be safely discharged to have further workup, including an exercise treadmill test as an outpatient.  LABORATORY DATA:  EKG:  Normal sinus rhythm with nonspecific T-wave abnormalities diffusely on both tracings.  ADDITIONAL LABORATORY DATA:  Her CBC was within normal limits with a white count of 5400, hemoglobin 11.6, hematocrit 36%, MCV 75.8, platelet count of 328,000.  Her chemistry profile is not in the record at this time.  Chest x-ray showed no acute process.  CONSULTATIONS:  Dr. Charolette Child for cardiology service.  ______  INFECTIONS:  None.  TRANSFUSIONS:   None.  RECOMMENDATIONS ON DISCHARGE:  The patient would continue her previous medications, including her Norvasc 5 mg daily, her Paxil 30 mg daily, her aspirin should be enteric-coated in the form of 81 mg instead of a chewable baby aspirin.  Premarin 0.625 mg daily, calcium carbonate b.i.d. for calcium source.  I have asked her to use some over-the-counter Pepcid two tablets every evening with supper for the next week, and to avoid any excess acid foods.  She would call the office to be seen by Dr. Renne Crigler in the next two weeks, and arrange for an exercise EKG.  CONDITION ON DISCHARGE:  Stable/improved. DD:  03/07/00 TD:  03/08/00 Job: [id number]

## 2010-08-17 IMAGING — CR DG CHEST 1V PORT
1 series · 1 of 1 positions shown · non-contrast
Comparison: 01/22/2007

CLINICAL DATA: Preop for gallbladder surgery.

PORTABLE CHEST - 1 VIEW

[view not recorded]
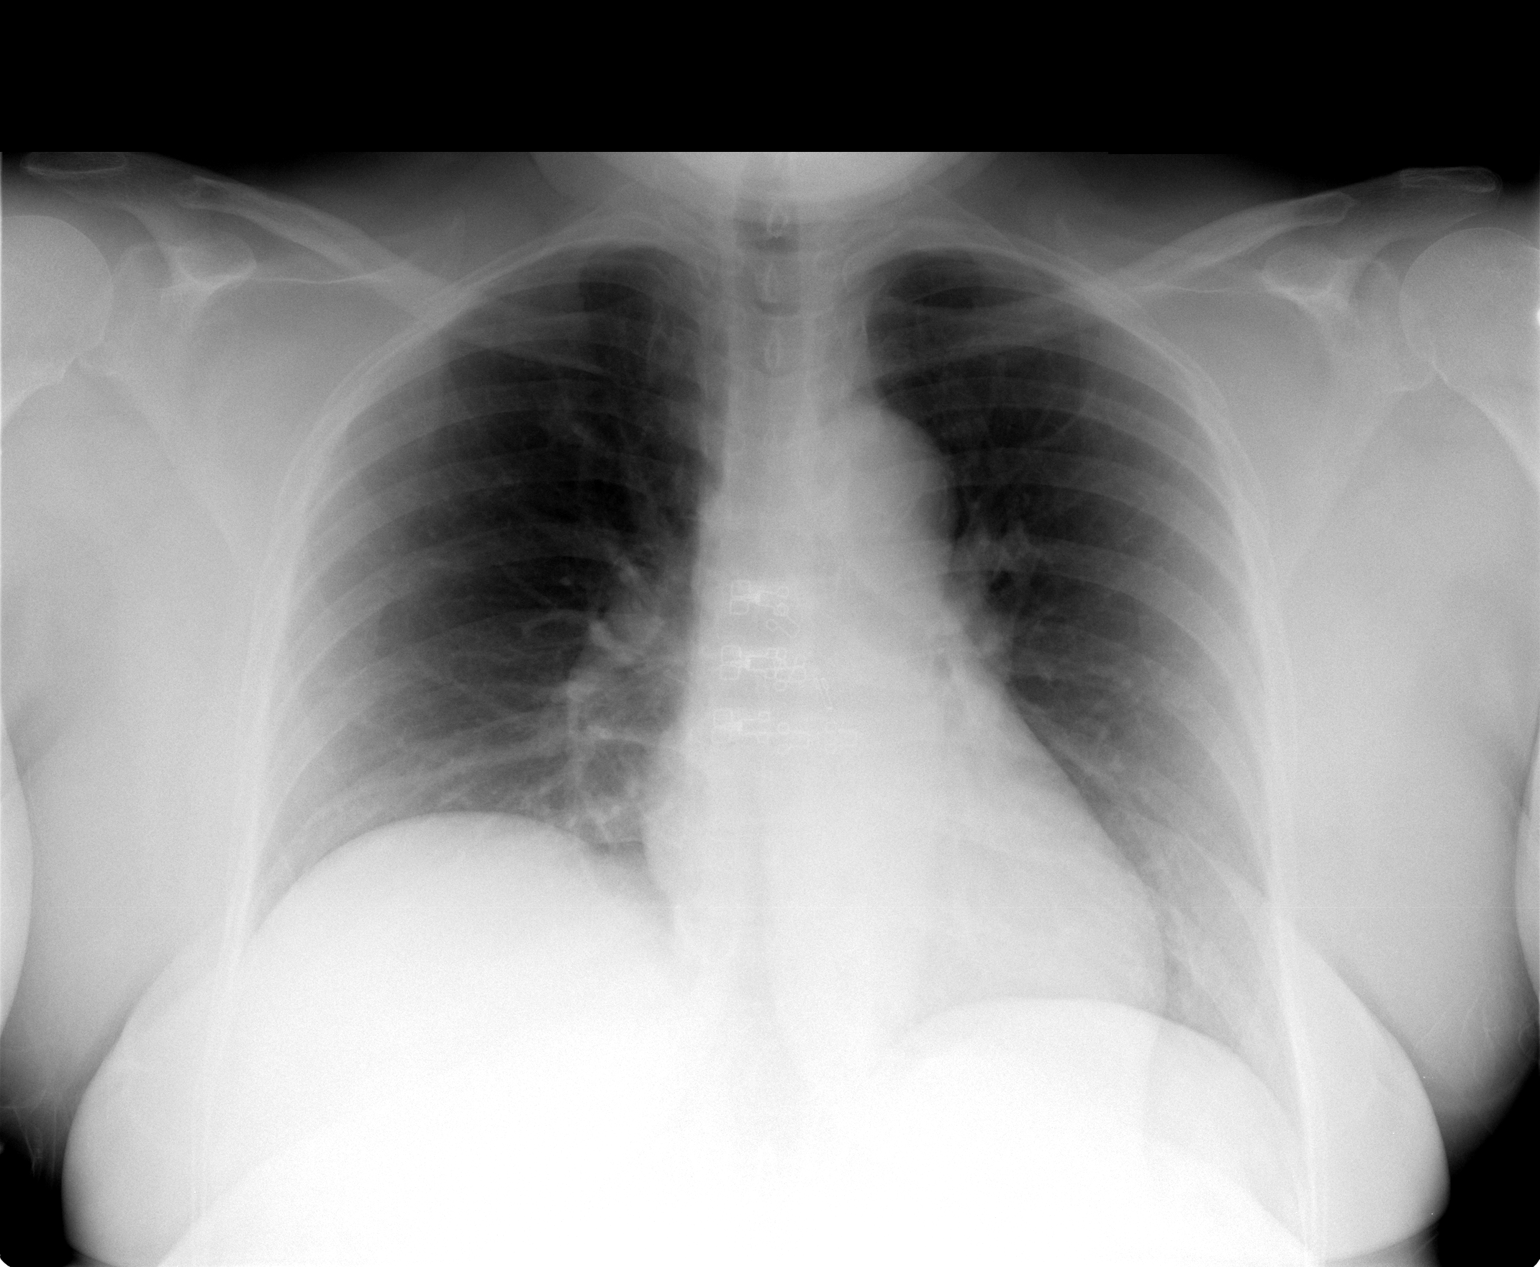

[1 of 1 positions shown; findings below may reference images not displayed]

FINDINGS: Midline trachea. Normal heart size and mediastinal
contours. No pleural effusion or pneumothorax. Clear lungs. Right
hemidiaphragm elevation is similar.
IMPRESSION: No acute cardiopulmonary disease.

## 2010-08-18 IMAGING — RF DG CHOLANGIOGRAM OPERATIVE
1 series · 4 of 4 positions shown · non-contrast
Comparison: Ultrasound abdomen 10/19/2007

CLINICAL DATA: Biliary colic.

INTRAOPERATIVE CHOLANGIOGRAM
TECHNIQUE: Multiple fluoroscopic spot radiographs were obtained
during intraoperative cholangiogram and are submitted for
interpretation post-operatively.

[Series 1: run · 4 of 99 frames shown]
[frame 15/99]
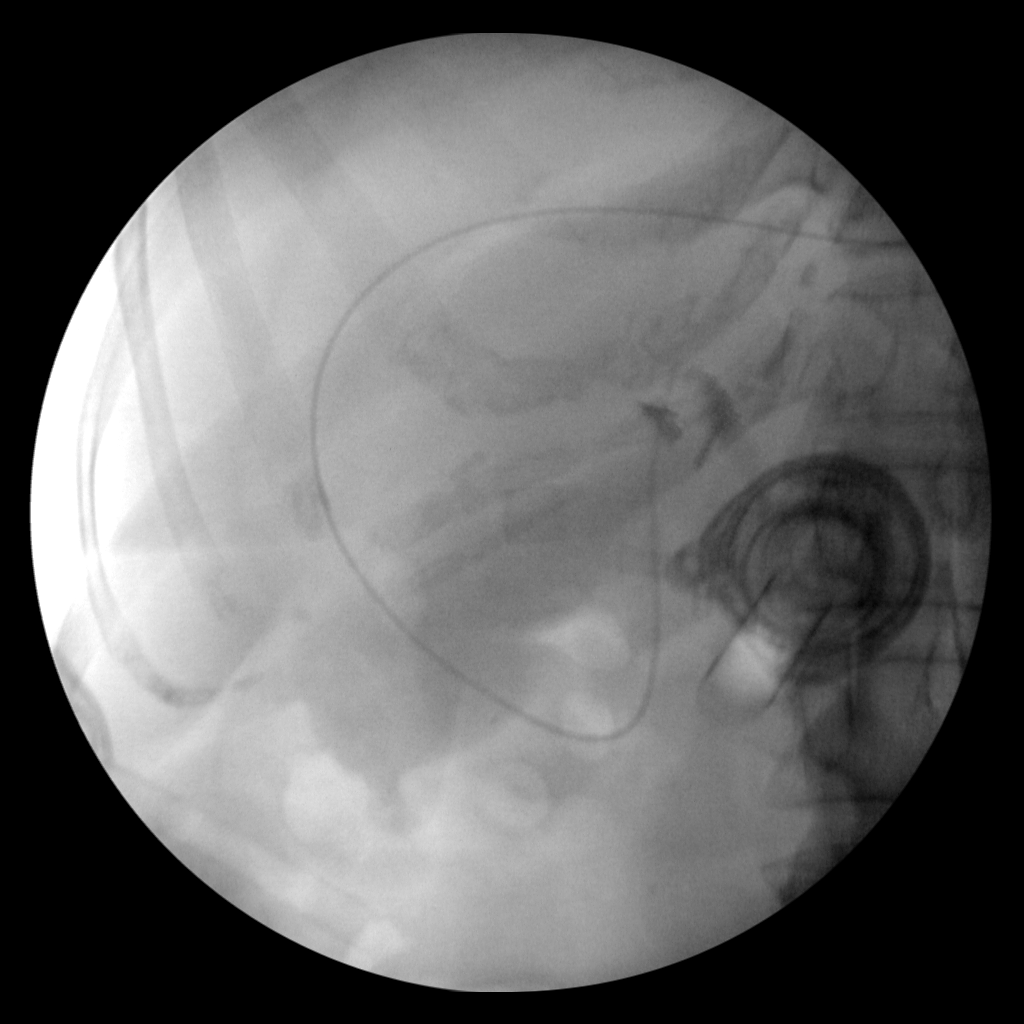
[frame 50/99]
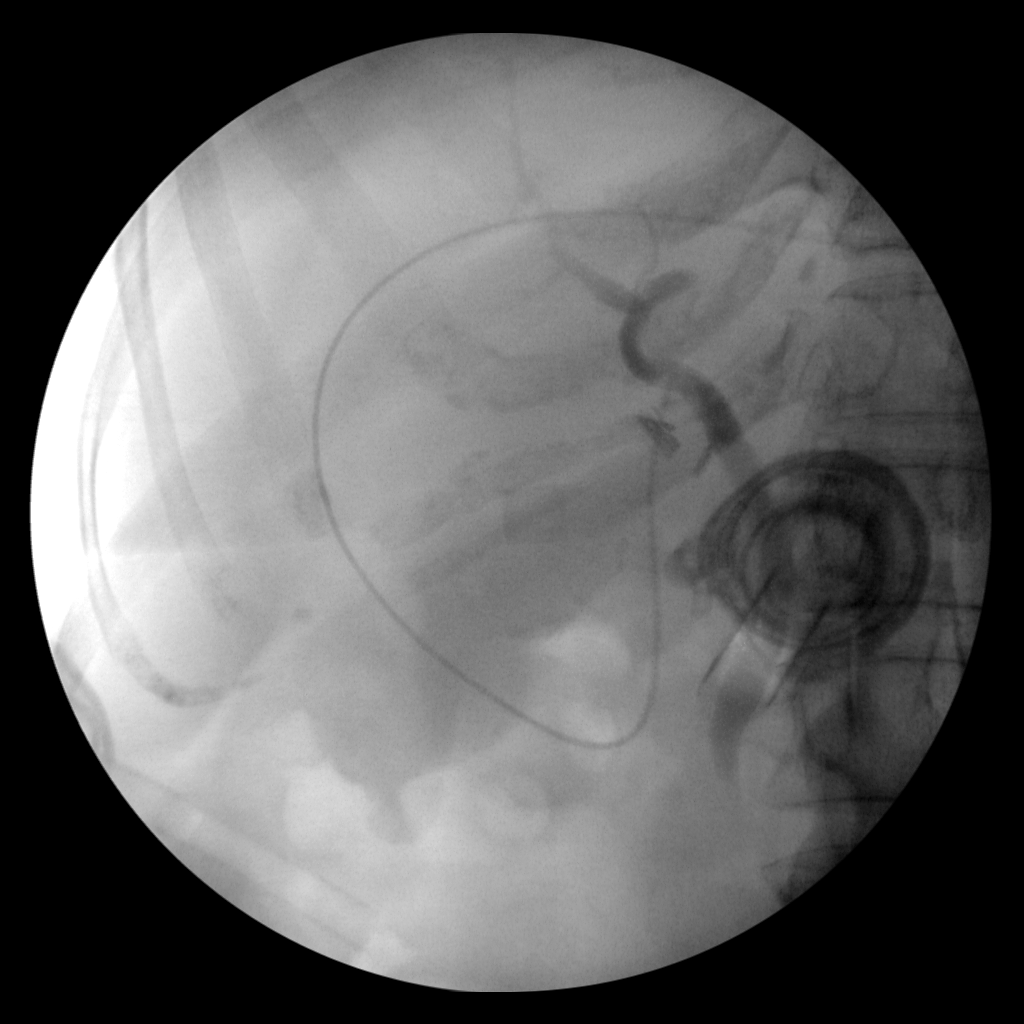
[frame 85/99]
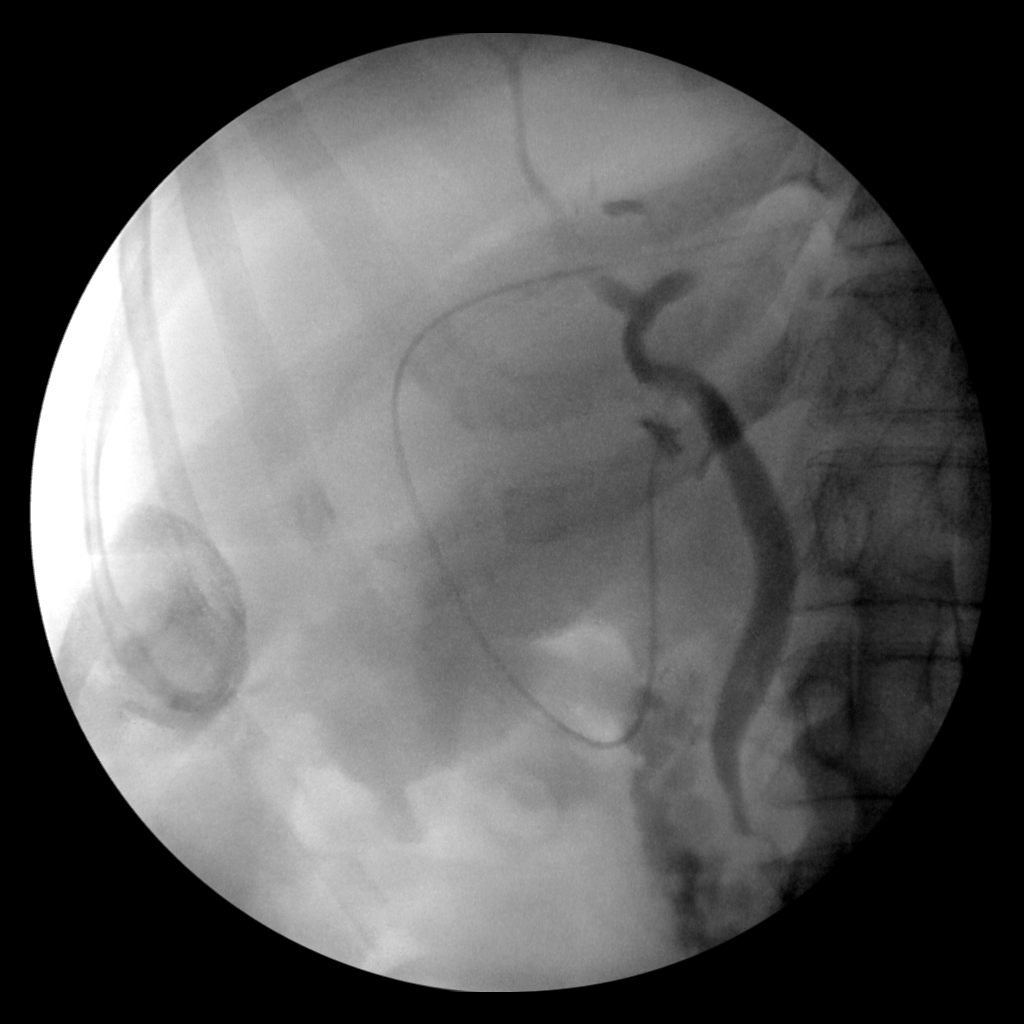
[frame 97/99]
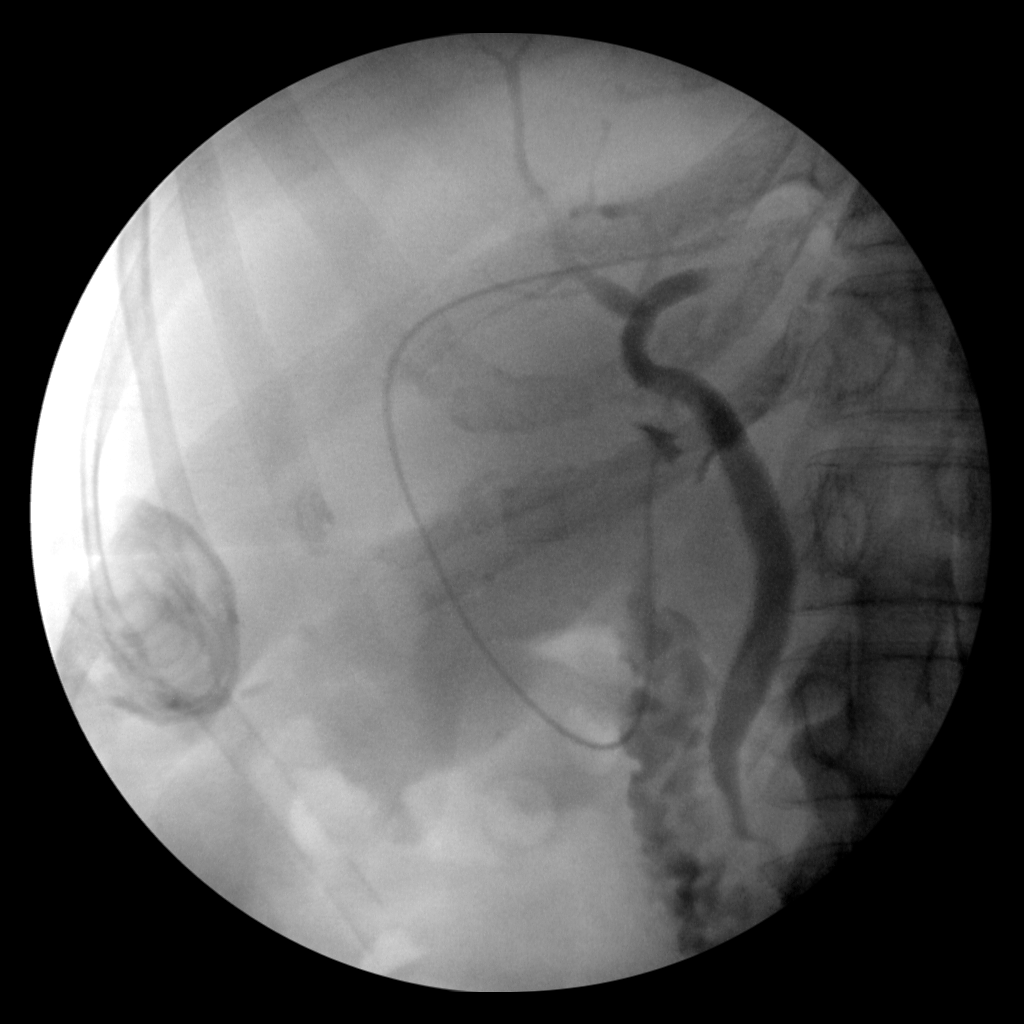

[4 of 4 positions shown; findings below may reference images not displayed]

FINDINGS: Cine fluoroscopic imaging from a intraoperative
cholangiogram demonstrate cannulation of the cystic duct.  The
common bile duct is normal in caliber.  No filling defects to
suggest common bile duct stones.  The visualized intrahepatic ducts
appear normal.  Normal drainage of contrast into the duodenum.
IMPRESSION: 1.  Normal intraoperative cholangiogram.

## 2010-10-10 LAB — URINALYSIS, ROUTINE W REFLEX MICROSCOPIC
Ketones, ur: NEGATIVE
Leukocytes, UA: NEGATIVE
Specific Gravity, Urine: 1.026
pH: 7

## 2010-10-10 LAB — CBC
MCHC: 31.9
MCV: 75.6 — ABNORMAL LOW
WBC: 8.3

## 2010-10-10 LAB — URINE MICROSCOPIC-ADD ON

## 2010-10-10 LAB — POCT I-STAT CREATININE: Creatinine, Ser: 1

## 2010-10-10 LAB — I-STAT 8, (EC8 V) (CONVERTED LAB)
BUN: 11
Glucose, Bld: 138 — ABNORMAL HIGH
Hemoglobin: 13.9
Operator id: 285841

## 2010-10-10 LAB — POCT CARDIAC MARKERS
CKMB, poc: <1 — ABNORMAL LOW
Myoglobin, poc: 48.8
Operator id: 270651
Troponin i, poc: <0.05

## 2010-10-10 LAB — DIFFERENTIAL
Lymphs Abs: 0.9
Neutro Abs: 7.2

## 2010-10-21 LAB — URINALYSIS, ROUTINE W REFLEX MICROSCOPIC
Bilirubin Urine: NEGATIVE
Glucose, UA: NEGATIVE
Hgb urine dipstick: NEGATIVE
Protein, ur: NEGATIVE
pH: 6.5

## 2010-10-21 LAB — COMPREHENSIVE METABOLIC PANEL
ALT: 44 — ABNORMAL HIGH
AST: 32
Alkaline Phosphatase: 102
Alkaline Phosphatase: 91
BUN: 9
BUN: 6
CO2: 25
Calcium: 9.7
Chloride: 103
Chloride: 100
Creatinine, Ser: 0.83
GFR calc non Af Amer: >60
Glucose, Bld: 164 — ABNORMAL HIGH
Potassium: 3.7
Potassium: 3.2 — ABNORMAL LOW
Sodium: 133 — ABNORMAL LOW
Total Bilirubin: 0.9

## 2010-10-21 LAB — DIFFERENTIAL
Basophils Absolute: 0
Lymphs Abs: 2
Monocytes Relative: 3
Neutrophils Relative %: 79 — ABNORMAL HIGH

## 2010-10-21 LAB — CBC
MCV: 77.1 — ABNORMAL LOW
Platelets: 225
RBC: 4.35
RDW: 13.5
WBC: 11 — ABNORMAL HIGH
WBC: 11.2 — ABNORMAL HIGH

## 2011-01-30 DIAGNOSIS — J069 Acute upper respiratory infection, unspecified: Secondary | ICD-10-CM | POA: Diagnosis not present

## 2011-05-15 DIAGNOSIS — E7800 Pure hypercholesterolemia, unspecified: Secondary | ICD-10-CM | POA: Diagnosis not present

## 2011-05-15 DIAGNOSIS — E119 Type 2 diabetes mellitus without complications: Secondary | ICD-10-CM | POA: Diagnosis not present

## 2011-05-19 DIAGNOSIS — E119 Type 2 diabetes mellitus without complications: Secondary | ICD-10-CM | POA: Diagnosis not present

## 2011-05-19 DIAGNOSIS — I10 Essential (primary) hypertension: Secondary | ICD-10-CM | POA: Diagnosis not present

## 2011-05-19 DIAGNOSIS — E7800 Pure hypercholesterolemia, unspecified: Secondary | ICD-10-CM | POA: Diagnosis not present

## 2011-09-16 DIAGNOSIS — E7800 Pure hypercholesterolemia, unspecified: Secondary | ICD-10-CM | POA: Diagnosis not present

## 2011-09-16 DIAGNOSIS — E119 Type 2 diabetes mellitus without complications: Secondary | ICD-10-CM | POA: Diagnosis not present

## 2011-09-18 DIAGNOSIS — E7800 Pure hypercholesterolemia, unspecified: Secondary | ICD-10-CM | POA: Diagnosis not present

## 2011-09-18 DIAGNOSIS — I10 Essential (primary) hypertension: Secondary | ICD-10-CM | POA: Diagnosis not present

## 2011-09-18 DIAGNOSIS — E119 Type 2 diabetes mellitus without complications: Secondary | ICD-10-CM | POA: Diagnosis not present

## 2012-03-16 DIAGNOSIS — E7800 Pure hypercholesterolemia, unspecified: Secondary | ICD-10-CM | POA: Diagnosis not present

## 2012-03-16 DIAGNOSIS — E119 Type 2 diabetes mellitus without complications: Secondary | ICD-10-CM | POA: Diagnosis not present

## 2012-03-18 DIAGNOSIS — E7800 Pure hypercholesterolemia, unspecified: Secondary | ICD-10-CM | POA: Diagnosis not present

## 2012-03-18 DIAGNOSIS — E119 Type 2 diabetes mellitus without complications: Secondary | ICD-10-CM | POA: Diagnosis not present

## 2012-03-18 DIAGNOSIS — I10 Essential (primary) hypertension: Secondary | ICD-10-CM | POA: Diagnosis not present

## 2012-03-24 DIAGNOSIS — M5126 Other intervertebral disc displacement, lumbar region: Secondary | ICD-10-CM | POA: Diagnosis not present

## 2012-03-29 DIAGNOSIS — M5126 Other intervertebral disc displacement, lumbar region: Secondary | ICD-10-CM | POA: Diagnosis not present

## 2012-05-27 ENCOUNTER — Encounter (HOSPITAL_COMMUNITY): Payer: Self-pay | Admitting: Emergency Medicine

## 2012-05-27 ENCOUNTER — Emergency Department (HOSPITAL_COMMUNITY)
Admission: EM | Admit: 2012-05-27 | Discharge: 2012-05-27 | Disposition: A | Discharge status: Home / Self Care | Payer: Federal, State, Local not specified - PPO | Attending: Emergency Medicine | Admitting: Emergency Medicine

## 2012-05-27 DIAGNOSIS — Z8673 Personal history of transient ischemic attack (TIA), and cerebral infarction without residual deficits: Secondary | ICD-10-CM | POA: Insufficient documentation

## 2012-05-27 DIAGNOSIS — Z791 Long term (current) use of non-steroidal anti-inflammatories (NSAID): Secondary | ICD-10-CM | POA: Diagnosis not present

## 2012-05-27 DIAGNOSIS — Z79899 Other long term (current) drug therapy: Secondary | ICD-10-CM | POA: Insufficient documentation

## 2012-05-27 DIAGNOSIS — R0982 Postnasal drip: Secondary | ICD-10-CM | POA: Insufficient documentation

## 2012-05-27 DIAGNOSIS — Z7982 Long term (current) use of aspirin: Secondary | ICD-10-CM | POA: Diagnosis not present

## 2012-05-27 DIAGNOSIS — F3289 Other specified depressive episodes: Secondary | ICD-10-CM | POA: Insufficient documentation

## 2012-05-27 DIAGNOSIS — F329 Major depressive disorder, single episode, unspecified: Secondary | ICD-10-CM | POA: Diagnosis not present

## 2012-05-27 DIAGNOSIS — R040 Epistaxis: Secondary | ICD-10-CM

## 2012-05-27 DIAGNOSIS — I10 Essential (primary) hypertension: Secondary | ICD-10-CM | POA: Insufficient documentation

## 2012-05-27 HISTORY — DX: Depression, unspecified: F32.A

## 2012-05-27 HISTORY — DX: Epistaxis: R04.0

## 2012-05-27 HISTORY — DX: Essential (primary) hypertension: I10

## 2012-05-27 HISTORY — DX: Cerebral infarction, unspecified: I63.9

## 2012-05-27 HISTORY — DX: Major depressive disorder, single episode, unspecified: F32.9

## 2012-05-27 LAB — CBC
HCT: 37.7 % (ref 36.0–46.0)
MCV: 74.7 fL — ABNORMAL LOW (ref 78.0–100.0)
RBC: 5.05 MIL/uL (ref 3.87–5.11)
RDW: 13.2 % (ref 11.5–15.5)
WBC: 5.4 10*3/uL (ref 4.0–10.5)

## 2012-05-27 MED ORDER — OXYMETAZOLINE HCL 0.05 % NA SOLN: 2.0000 | Freq: Two times a day (BID) | NASAL | Status: DC

## 2012-05-27 NOTE — ED Notes (Signed)
Pt was at the gym and began having a nosebleed, does have a hx of these. Not on blood thinners, does have a hx of htn, denies any headaches

## 2012-05-27 NOTE — ED Provider Notes (Signed)
History     CSN: [id number]  Arrival date & time 05/27/12  1248   First MD Initiated Contact with Patient 05/27/12 1253      Chief Complaint  Patient presents with  . Epistaxis    (Consider location/radiation/quality/duration/timing/severity/associated sxs/prior treatment) Patient is a 67 y.o. female presenting with nosebleeds. The history is provided by the patient. No language interpreter was used.  Epistaxis  This is a new problem. The current episode started 1 to 2 hours ago. Episode frequency: lasted for 45 minutes. The problem has been resolved. The problem is associated with an unknown (Patient does admit to taking 81mg  ASA daily) factor. The bleeding has been from both nares. She has tried applying pressure (and Afrin) for the symptoms. The treatment provided significant relief. Her past medical history does not include bleeding disorder, colds, sinus problems, allergies, nose-picking or frequent nosebleeds.    Past Medical History  Diagnosis Date  . Hypertension   . Nosebleed   . Depression   . Stroke     History reviewed. No pertinent past surgical history.  No family history on file.  History  Substance Use Topics  . Smoking status: Not on file  . Smokeless tobacco: Not on file  . Alcohol Use: Not on file    OB History   Grav Para Term Preterm Abortions TAB SAB Ect Mult Living                  Review of Systems  Constitutional: Negative for fever.  HENT: Positive for nosebleeds and postnasal drip. Negative for sore throat and trouble swallowing.   Respiratory: Negative for shortness of breath.   Skin: Negative for color change and pallor.  Neurological: Negative for dizziness, syncope and light-headedness.  All other systems reviewed and are negative.    Allergies  Oxycodone and Dilantin  Home Medications   Current Outpatient Rx  Name  Route  Sig  Dispense  Refill  . amLODipine-olmesartan (AZOR) 10-40 MG per tablet   Oral   Take 1 tablet by  mouth daily.         Marland Kitchen aspirin 81 MG tablet   Oral   Take 81 mg by mouth daily.         . meloxicam (MOBIC) 15 MG tablet   Oral   Take 15 mg by mouth daily.         . simvastatin (ZOCOR) 20 MG tablet   Oral   Take 20 mg by mouth every evening.         . venlafaxine XR (EFFEXOR-XR) 150 MG 24 hr capsule   Oral   Take 150 mg by mouth daily.         Marland Kitchen oxymetazoline (AFRIN NASAL SPRAY) 0.05 % nasal spray   Nasal   Place 2 sprays into the nose 2 (two) times daily. Used for no longer than 3 days   30 mL   0     BP 163/91  Pulse 102  SpO2 99%  Physical Exam  Nursing note and vitals reviewed. Constitutional: She is oriented to person, place, and time. She appears well-developed and well-nourished. No distress.  HENT:  Head: Normocephalic and atraumatic.  Nose: No nose lacerations, sinus tenderness or nasal deformity.  No foreign bodies. Right sinus exhibits no maxillary sinus tenderness and no frontal sinus tenderness. Left sinus exhibits no maxillary sinus tenderness and no frontal sinus tenderness.  Mouth/Throat: Oropharynx is clear and moist. No oropharyngeal exudate.  Turbinates mildly erythematous. No  source of epistaxis identified on visual examination.  Eyes: Conjunctivae and EOM are normal. Pupils are equal, round, and reactive to light. No scleral icterus.  Neck: Normal range of motion. Neck supple.  Cardiovascular: Normal rate, regular rhythm, normal heart sounds and intact distal pulses.   Pulmonary/Chest: Effort normal and breath sounds normal. No respiratory distress. She has no wheezes. She has no rales.  Musculoskeletal: Normal range of motion. She exhibits no edema.  Lymphadenopathy:    She has no cervical adenopathy.  Neurological: She is alert and oriented to person, place, and time.  Skin: Skin is warm and dry. No rash noted. She is not diaphoretic. No erythema. No pallor.  Psychiatric: She has a normal mood and affect. Her behavior is normal.     ED Course  Procedures (including critical care time)  Labs Reviewed  CBC - Abnormal; Notable for the following:    MCV 74.7 (*)    MCH 23.8 (*)    All other components within normal limits   No results found.   1. Epistaxis     MDM  Patient is a 67 year old female who presents for epistaxis onset while at the gym this morning and lasting for ~45 minutes. Patient endorses taking 81 mg aspirin daily. Bleeding controlled by applying pressure to b/l nares. On physical exam patient is well and nontoxic appearing and hemodynamically stable. On examination of the bilateral nares no source of the bleeding able to be identified. Will packed with Afrin soaked cotton balls and obtain CBC for further work up. Patient resting comfortably without complaints.  Patient's hemoglobin, hematocrit, and platelet level normal. Patient has remained pleasant and hemodynamically stable. Appropriate for discharge with ENT followup for further evaluation of symptoms. Patient given Afrin to use for 3 days and has been told to refrain from taking her aspirin for the next week. Indications for ED return provided. Patient states comfort and understanding with this discharge plan with no unaddressed concerns. Patient workup and management discussed with Dr. Radford Pax who is in agreement.        Antony Madura, PA-C 05/27/12 1523

## 2012-05-28 NOTE — ED Provider Notes (Signed)
Medical screening examination/treatment/procedure(s) were performed by non-physician practitioner and as supervising physician I was immediately available for consultation/collaboration.   Nelia Shi, MD 05/28/12 Jerene Bears

## 2012-09-14 DIAGNOSIS — E7800 Pure hypercholesterolemia, unspecified: Secondary | ICD-10-CM | POA: Diagnosis not present

## 2012-09-14 DIAGNOSIS — E119 Type 2 diabetes mellitus without complications: Secondary | ICD-10-CM | POA: Diagnosis not present

## 2012-09-16 DIAGNOSIS — E7800 Pure hypercholesterolemia, unspecified: Secondary | ICD-10-CM | POA: Diagnosis not present

## 2012-09-16 DIAGNOSIS — E119 Type 2 diabetes mellitus without complications: Secondary | ICD-10-CM | POA: Diagnosis not present

## 2012-09-16 DIAGNOSIS — I10 Essential (primary) hypertension: Secondary | ICD-10-CM | POA: Diagnosis not present

## 2013-01-11 DIAGNOSIS — J069 Acute upper respiratory infection, unspecified: Secondary | ICD-10-CM | POA: Diagnosis not present

## 2013-03-22 DIAGNOSIS — E7800 Pure hypercholesterolemia, unspecified: Secondary | ICD-10-CM | POA: Diagnosis not present

## 2013-03-22 DIAGNOSIS — E119 Type 2 diabetes mellitus without complications: Secondary | ICD-10-CM | POA: Diagnosis not present

## 2013-03-28 DIAGNOSIS — E7800 Pure hypercholesterolemia, unspecified: Secondary | ICD-10-CM | POA: Diagnosis not present

## 2013-03-28 DIAGNOSIS — E119 Type 2 diabetes mellitus without complications: Secondary | ICD-10-CM | POA: Diagnosis not present

## 2013-03-28 DIAGNOSIS — I10 Essential (primary) hypertension: Secondary | ICD-10-CM | POA: Diagnosis not present

## 2013-08-18 DIAGNOSIS — I10 Essential (primary) hypertension: Secondary | ICD-10-CM | POA: Diagnosis not present

## 2013-08-18 DIAGNOSIS — E119 Type 2 diabetes mellitus without complications: Secondary | ICD-10-CM | POA: Diagnosis not present

## 2013-08-18 DIAGNOSIS — E7800 Pure hypercholesterolemia, unspecified: Secondary | ICD-10-CM | POA: Diagnosis not present

## 2013-09-14 ENCOUNTER — Emergency Department (HOSPITAL_COMMUNITY)
Admission: EM | Admit: 2013-09-14 | Discharge: 2013-09-14 | Disposition: A | Discharge status: Home / Self Care | Payer: Medicare Other | Attending: Emergency Medicine | Admitting: Emergency Medicine

## 2013-09-14 ENCOUNTER — Encounter (HOSPITAL_COMMUNITY): Payer: Self-pay | Admitting: Emergency Medicine

## 2013-09-14 DIAGNOSIS — F3289 Other specified depressive episodes: Secondary | ICD-10-CM | POA: Insufficient documentation

## 2013-09-14 DIAGNOSIS — R5381 Other malaise: Secondary | ICD-10-CM | POA: Diagnosis not present

## 2013-09-14 DIAGNOSIS — R55 Syncope and collapse: Secondary | ICD-10-CM | POA: Insufficient documentation

## 2013-09-14 DIAGNOSIS — R404 Transient alteration of awareness: Secondary | ICD-10-CM | POA: Diagnosis not present

## 2013-09-14 DIAGNOSIS — Z8673 Personal history of transient ischemic attack (TIA), and cerebral infarction without residual deficits: Secondary | ICD-10-CM | POA: Diagnosis not present

## 2013-09-14 DIAGNOSIS — Z79899 Other long term (current) drug therapy: Secondary | ICD-10-CM | POA: Diagnosis not present

## 2013-09-14 DIAGNOSIS — R209 Unspecified disturbances of skin sensation: Secondary | ICD-10-CM | POA: Insufficient documentation

## 2013-09-14 DIAGNOSIS — R61 Generalized hyperhidrosis: Secondary | ICD-10-CM | POA: Diagnosis not present

## 2013-09-14 DIAGNOSIS — Z7982 Long term (current) use of aspirin: Secondary | ICD-10-CM | POA: Insufficient documentation

## 2013-09-14 DIAGNOSIS — I10 Essential (primary) hypertension: Secondary | ICD-10-CM | POA: Diagnosis not present

## 2013-09-14 DIAGNOSIS — R5383 Other fatigue: Secondary | ICD-10-CM | POA: Diagnosis not present

## 2013-09-14 DIAGNOSIS — F329 Major depressive disorder, single episode, unspecified: Secondary | ICD-10-CM | POA: Diagnosis not present

## 2013-09-14 LAB — BASIC METABOLIC PANEL
ANION GAP: 14 (ref 5–15)
BUN: 17 mg/dL (ref 6–23)
CHLORIDE: 99 meq/L (ref 96–112)
CO2: 24 mEq/L (ref 19–32)
CREATININE: 0.73 mg/dL (ref 0.50–1.10)
Calcium: 9.5 mg/dL (ref 8.4–10.5)
GFR, EST NON AFRICAN AMERICAN: 85 mL/min — AB (ref >90)
Glucose, Bld: 107 mg/dL — ABNORMAL HIGH (ref 70–99)
Potassium: 4.1 mEq/L (ref 3.7–5.3)
Sodium: 137 mEq/L (ref 137–147)

## 2013-09-14 LAB — CBC
HCT: 36.9 % (ref 36.0–46.0)
Hemoglobin: 12 g/dL (ref 12.0–15.0)
MCH: 24.4 pg — ABNORMAL LOW (ref 26.0–34.0)
MCHC: 32.5 g/dL (ref 30.0–36.0)
MCV: 75.2 fL — AB (ref 78.0–100.0)
PLATELETS: 245 10*3/uL (ref 150–400)
RBC: 4.91 MIL/uL (ref 3.87–5.11)
RDW: 13.2 % (ref 11.5–15.5)
WBC: 6.7 10*3/uL (ref 4.0–10.5)

## 2013-09-14 LAB — I-STAT TROPONIN, ED: Troponin i, poc: 0 ng/mL (ref 0.00–0.08)
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
## 2013-09-14 NOTE — ED Provider Notes (Signed)
CSN: [id number]     Arrival date & time 09/14/13  1629 History   First MD Initiated Contact with Patient 09/14/13 1629     Chief Complaint  Patient presents with  . Loss of Consciousness     (Consider location/radiation/quality/duration/timing/severity/associated sxs/prior Treatment) Patient is a 68 y.o. female presenting with syncope. The history is provided by the patient.  Loss of Consciousness Episode history:  Single Most recent episode:  Today Duration:  5 minutes Timing:  Constant Progression:  Resolved Chronicity:  Recurrent Context comment:  Under the hairdryer - extremely hot Witnessed: yes   Relieved by:  Nothing Associated symptoms: diaphoresis   Associated symptoms: no confusion, no difficulty breathing, no fever, no recent fall, no recent injury, no shortness of breath and no vomiting   Associated symptoms comment:  Urinary incontinence   Past Medical History  Diagnosis Date  . Hypertension   . Nosebleed   . Depression   . Stroke    History reviewed. No pertinent past surgical history. History reviewed. No pertinent family history. History  Substance Use Topics  . Smoking status: Not on file  . Smokeless tobacco: Not on file  . Alcohol Use: Not on file   OB History   Grav Para Term Preterm Abortions TAB SAB Ect Mult Living                 Review of Systems  Constitutional: Positive for diaphoresis. Negative for fever and chills.  Respiratory: Negative for cough and shortness of breath.   Cardiovascular: Positive for syncope.  Gastrointestinal: Negative for vomiting and abdominal pain.  Neurological: Positive for numbness (head numbness prior to syncopal episode).  Psychiatric/Behavioral: Negative for confusion.  All other systems reviewed and are negative.     Allergies  Oxycodone and Dilantin  Home Medications   Prior to Admission medications   Medication Sig Start Date End Date Taking? Authorizing Provider  amLODipine-olmesartan (AZOR)  10-40 MG per tablet Take 1 tablet by mouth daily.   Yes Historical Provider, MD  aspirin 81 MG tablet Take 81 mg by mouth at bedtime.    Yes Historical Provider, MD  Calcium Citrate-Vitamin D (CALCITRATE/VITAMIN D PO) Take 1 tablet by mouth daily.   Yes Historical Provider, MD  Cholecalciferol (VITAMIN D) 2000 UNITS CAPS Take 2,000 Units by mouth daily.   Yes Historical Provider, MD  ibuprofen (ADVIL,MOTRIN) 200 MG tablet Take 400 mg by mouth every 6 (six) hours as needed for moderate pain.   Yes Historical Provider, MD  Multiple Vitamin (MULTIVITAMIN WITH MINERALS) TABS tablet Take 1 tablet by mouth daily.   Yes Historical Provider, MD  simvastatin (ZOCOR) 20 MG tablet Take 10 mg by mouth every evening.    Yes Historical Provider, MD  venlafaxine XR (EFFEXOR-XR) 150 MG 24 hr capsule Take 150 mg by mouth daily with breakfast.    Yes Historical Provider, MD   BP 150/70  Pulse 74  Temp(Src) 98.3 F (36.8 C) (Oral)  Resp 18  Ht 5\' 4"  (1.626 m)  Wt 179 lb (81.194 kg)  BMI 30.71 kg/m2  SpO2 100% Physical Exam  Nursing note and vitals reviewed. Constitutional: She is oriented to person, place, and time. She appears well-developed and well-nourished. No distress.  HENT:  Head: Normocephalic and atraumatic.  Mouth/Throat: Oropharynx is clear and moist.  Eyes: EOM are normal. Pupils are equal, round, and reactive to light.  Neck: Normal range of motion. Neck supple.  Cardiovascular: Normal rate and regular rhythm.  Exam reveals no  friction rub.   No murmur heard. Pulmonary/Chest: Effort normal and breath sounds normal. No respiratory distress. She has no wheezes. She has no rales.  Abdominal: Soft. She exhibits no distension. There is no tenderness. There is no rebound.  Musculoskeletal: Normal range of motion. She exhibits no edema.  Neurological: She is alert and oriented to person, place, and time. No cranial nerve deficit or sensory deficit. She exhibits normal muscle tone. Coordination and  gait normal. GCS eye subscore is 4. GCS verbal subscore is 5. GCS motor subscore is 6.  Skin: No rash noted. She is not diaphoretic.    ED Course  Procedures (including critical care time) Labs Review Labs Reviewed  CBC - Abnormal; Notable for the following:    MCV 75.2 (*)    MCH 24.4 (*)    All other components within normal limits  BASIC METABOLIC PANEL - Abnormal; Notable for the following:    Glucose, Bld 107 (*)    GFR calc non Af Amer 85 (*)    All other components within normal limits  I-STAT TROPOININ, ED    Imaging Review No results found.   EKG Interpretation   Date/Time:  Wednesday September 14 2013 16:38:00 EDT Ventricular Rate:  72 PR Interval:  153 QRS Duration: 72 QT Interval:  380 QTC Calculation: 416 R Axis:     Text Interpretation:  Sinus rhythm Borderline T wave abnormalities Similar  to prior Confirmed by Mingo Amber  MD, [place] ([id number]) on 09/14/2013 4:54:44 PM      MDM   Final diagnoses:  Vasovagal syncope    60F presents with syncope. Hx of same - multiple episodes over the past several years. Was under the hairdryer at the salon and passed out. Had episode of incontinence. She had an aura of "head numbness" and weakness and she closed her eyes a new happening. This aura of head numbness is similar to prior episodes. No preceding chest pain, shortness of breath. Here vitals are stable, she states she's feeling well and would like to go home.  Patient's exam is normal. EKG is normal. Neurologically intact. Will check basic labs. Labs ok. Patient stable for discharge. Hx of similar syncope, instructed her to f/u with Dr. Shelia Media.  Evelina Bucy, MD 09/15/13 [phone]

## 2013-09-14 NOTE — Discharge Instructions (Signed)
Syncope °Syncope is a medical term for fainting or passing out. This means you lose consciousness and drop to the ground. People are generally unconscious for less than 5 minutes. You may have some muscle twitches for up to 15 seconds before waking up and returning to normal. Syncope occurs more often in older adults, but it can happen to anyone. While most causes of syncope are not dangerous, syncope can be a sign of a serious medical problem. It is important to seek medical care.  °CAUSES  °Syncope is caused by a sudden drop in blood flow to the brain. The specific cause is often not determined. Factors that can bring on syncope include: °· Taking medicines that lower blood pressure. °· Sudden changes in posture, such as standing up quickly. °· Taking more medicine than prescribed. °· Standing in one place for too long. °· Seizure disorders. °· Dehydration and excessive exposure to heat. °· Low blood sugar (hypoglycemia). °· Straining to have a bowel movement. °· Heart disease, irregular heartbeat, or other circulatory problems. °· Fear, emotional distress, seeing blood, or severe pain. °SYMPTOMS  °Right before fainting, you may: °· Feel dizzy or light-headed. °· Feel nauseous. °· See all white or all black in your field of vision. °· Have cold, clammy skin. °DIAGNOSIS  °Your health care provider will ask about your symptoms, perform a physical exam, and perform an electrocardiogram (ECG) to record the electrical activity of your heart. Your health care provider may also perform other heart or blood tests to determine the cause of your syncope which may include: °· Transthoracic echocardiogram (TTE). During echocardiography, sound waves are used to evaluate how blood flows through your heart. °· Transesophageal echocardiogram (TEE). °· Cardiac monitoring. This allows your health care provider to monitor your heart rate and rhythm in real time. °· Holter monitor. This is a portable device that records your  heartbeat and can help diagnose heart arrhythmias. It allows your health care provider to track your heart activity for several days, if needed. °· Stress tests by exercise or by giving medicine that makes the heart beat faster. °TREATMENT  °In most cases, no treatment is needed. Depending on the cause of your syncope, your health care provider may recommend changing or stopping some of your medicines. °HOME CARE INSTRUCTIONS °· Have someone stay with you until you feel stable. °· Do not drive, use machinery, or play sports until your health care provider says it is okay. °· Keep all follow-up appointments as directed by your health care provider. °· Lie down right away if you start feeling like you might faint. Breathe deeply and steadily. Wait until all the symptoms have passed. °· Drink enough fluids to keep your urine clear or pale yellow. °· If you are taking blood pressure or heart medicine, get up slowly and take several minutes to sit and then stand. This can reduce dizziness. °SEEK IMMEDIATE MEDICAL CARE IF:  °· You have a severe headache. °· You have unusual pain in the chest, abdomen, or back. °· You are bleeding from your mouth or rectum, or you have black or tarry stool. °· You have an irregular or very fast heartbeat. °· You have pain with breathing. °· You have repeated fainting or seizure-like jerking during an episode. °· You faint when sitting or lying down. °· You have confusion. °· You have trouble walking. °· You have severe weakness. °· You have vision problems. °If you fainted, call your local emergency services (911 in [place] Do not drive   yourself to the hospital.  °MAKE SURE YOU: °· Understand these instructions. °· Will watch your condition. °· Will get help right away if you are not doing well or get worse. °Document Released: 01/06/2005 Document Revised: 01/11/2013 Document Reviewed: 03/07/2011 °[employer]® Patient Information ©2015 [employer]. This information is not intended to replace  advice given to you by your health care provider. Make sure you discuss any questions you have with your health care provider. ° °

## 2013-09-19 DIAGNOSIS — R55 Syncope and collapse: Secondary | ICD-10-CM | POA: Diagnosis not present

## 2013-09-19 DIAGNOSIS — R32 Unspecified urinary incontinence: Secondary | ICD-10-CM | POA: Diagnosis not present

## 2013-09-19 DIAGNOSIS — Z8679 Personal history of other diseases of the circulatory system: Secondary | ICD-10-CM | POA: Diagnosis not present

## 2013-09-19 DIAGNOSIS — Z8673 Personal history of transient ischemic attack (TIA), and cerebral infarction without residual deficits: Secondary | ICD-10-CM | POA: Diagnosis not present

## 2013-09-22 ENCOUNTER — Encounter: Payer: Self-pay | Admitting: Neurology

## 2013-09-22 ENCOUNTER — Ambulatory Visit (INDEPENDENT_AMBULATORY_CARE_PROVIDER_SITE_OTHER): Payer: Medicare Other | Admitting: Neurology

## 2013-09-22 VITALS — BP 150/84 | HR 84 | Temp 98.3°F | Ht 63.0 in | Wt 179.0 lb

## 2013-09-22 DIAGNOSIS — R569 Unspecified convulsions: Secondary | ICD-10-CM

## 2013-09-22 DIAGNOSIS — R6889 Other general symptoms and signs: Secondary | ICD-10-CM

## 2013-09-22 DIAGNOSIS — IMO0001 Reserved for inherently not codable concepts without codable children: Secondary | ICD-10-CM

## 2013-09-22 DIAGNOSIS — Z8673 Personal history of transient ischemic attack (TIA), and cerebral infarction without residual deficits: Secondary | ICD-10-CM

## 2013-09-22 DIAGNOSIS — R55 Syncope and collapse: Secondary | ICD-10-CM | POA: Diagnosis not present
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
## 2013-09-22 NOTE — Patient Instructions (Addendum)
You can restart your aspirin.   I am not convinced you have seizures, it sounds like you have syncopal spell. Dr. Shelia Media may want you to see a cardiologist. Please do not drive until you are symptom free for 6 monts.  We will do an EEG and MRI brain and call you with the results.  Vasovagal syncope is one of the most common causes of fainting. Vasovagal syncope occurs when your body overreacts to certain triggers, such as the sight of blood or extreme emotional distress or overheating.  The vasovagal syncope trigger causes a sudden drop in your heart rate and blood pressure, which leads to reduced blood flow to your brain, which results in a brief loss of consciousness. Vasovagal syncope is usually harmless and requires no treatment. However, if you collapse and fall, it is possible you may injure yourself.  Pre-sycopal symptoms include (but are not limited to): Skin paleness, lightheadedness, tunnel vision, nausea, a rising feeling of warmth, feeling cold and clammy, excess yawning, and blurry vision. You may have jerky, abnormal movements, a slow and weak pulse and dilated pupils and you may have some confusion and mental slowing when you come to.  Common triggers for vasovagal syncope include (but are not limited to): Standing for long periods of time, heat exposure, the sight of blood or having blood drawn, fear and straining, such as during a bowel movement.  There is no specific medication for treatment of fainting. Sometimes we use medications to keep the blood pressure elevated but this is rare. Supportive treatment includes foot exercises, wearing compression stockings or tensing your leg muscles when standing, increasing salt in your diet (unless you have high blood pressure). Avoid prolonged standing - especially in hot, crowded places - and drink plenty of fluids and change position from sitting to standing or lying to standing slowly and avoid overheating.

## 2013-09-27 DIAGNOSIS — E7800 Pure hypercholesterolemia, unspecified: Secondary | ICD-10-CM | POA: Diagnosis not present

## 2013-09-27 DIAGNOSIS — I10 Essential (primary) hypertension: Secondary | ICD-10-CM | POA: Diagnosis not present

## 2013-09-27 DIAGNOSIS — E119 Type 2 diabetes mellitus without complications: Secondary | ICD-10-CM | POA: Diagnosis not present

## 2013-10-03 ENCOUNTER — Other Ambulatory Visit: Payer: Medicare Other

## 2013-10-03 DIAGNOSIS — I10 Essential (primary) hypertension: Secondary | ICD-10-CM | POA: Diagnosis not present

## 2013-10-03 DIAGNOSIS — E119 Type 2 diabetes mellitus without complications: Secondary | ICD-10-CM | POA: Diagnosis not present

## 2013-10-03 DIAGNOSIS — E7800 Pure hypercholesterolemia, unspecified: Secondary | ICD-10-CM | POA: Diagnosis not present

## 2013-10-06 ENCOUNTER — Ambulatory Visit (INDEPENDENT_AMBULATORY_CARE_PROVIDER_SITE_OTHER): Payer: Medicare Other

## 2013-10-06 DIAGNOSIS — Z8673 Personal history of transient ischemic attack (TIA), and cerebral infarction without residual deficits: Secondary | ICD-10-CM

## 2013-10-06 DIAGNOSIS — R55 Syncope and collapse: Secondary | ICD-10-CM | POA: Diagnosis not present

## 2013-10-06 DIAGNOSIS — R6889 Other general symptoms and signs: Secondary | ICD-10-CM

## 2013-10-06 DIAGNOSIS — IMO0001 Reserved for inherently not codable concepts without codable children: Secondary | ICD-10-CM

## 2013-10-06 NOTE — Procedures (Signed)
    History:  Sherri Torres is a 68 year old patient with a history of syncopal episodes associated with urinary incontinence. The patient has a history of cerebrovascular disease with a prior TIA event and a prior cerebral aneurysm requiring clipping in the 1980s. She also has a history of head trauma. During the syncopal episode, the patient may have diaphoresis sometimes associated with nausea, occasionally vomiting. The patient is being evaluated for these events.  This is a routine EEG. No skull defects are noted. Medications include Azor, aspirin, calcium supplementation, vitamin D, ibuprofen, multivitamins, Zocor, and Effexor.   EEG classification: Normal awake  Description of the recording: The background rhythms of this recording consists of a fairly well modulated medium amplitude alpha rhythm of 10 Hz that is reactive to eye opening and closure. As the record progresses, the patient appears to remain in the waking state throughout the recording. Photic stimulation was performed, resulting in a bilateral and symmetric photic driving response. Hyperventilation was also performed, resulting in a minimal buildup of the background rhythm activities without significant slowing seen. At no time during the recording does there appear to be evidence of spike or spike wave discharges or evidence of focal slowing. EKG monitor shows no evidence of cardiac rhythm abnormalities with a heart rate of 72.  Impression: This is a normal EEG recording in the waking state. No evidence of ictal or interictal discharges are seen.

## 2013-10-07 NOTE — Progress Notes (Signed)

## 2013-10-07 NOTE — Progress Notes (Signed)
Quick Note:  Shared normal EEG results with patient, verbalized understanding ______

## 2013-10-12 ENCOUNTER — Ambulatory Visit
Admission: RE | Admit: 2013-10-12 | Discharge: 2013-10-12 | Disposition: A | Discharge status: Home / Self Care | Payer: Medicare Other | Source: Ambulatory Visit | Attending: Neurology | Admitting: Neurology

## 2013-10-12 DIAGNOSIS — R55 Syncope and collapse: Secondary | ICD-10-CM

## 2013-10-12 DIAGNOSIS — Z8673 Personal history of transient ischemic attack (TIA), and cerebral infarction without residual deficits: Secondary | ICD-10-CM

## 2013-10-12 DIAGNOSIS — IMO0001 Reserved for inherently not codable concepts without codable children: Secondary | ICD-10-CM

## 2013-10-12 DIAGNOSIS — R6889 Other general symptoms and signs: Secondary | ICD-10-CM

## 2013-10-19 NOTE — Progress Notes (Signed)
Quick Note:  Please call patient regarding her brain MRI: No acute changes were seen. Postsurgical changes were seen from her previous aneurysm surgery. There was a normal structure of the brain and no significant volume loss or atrophy. There were changes in the deeper structures of the brain, which we call white matter changes or microvascular changes. These were reported as mild in Her case. These are tiny white spots, that occur with time and are seen in a variety of conditions, including with normal aging, chronic hypertension, chronic headaches, especially migraine HAs, chronic diabetes, chronic hyperlipidemia. These are not strokes and no mass or lesion or contrast enhancement was seen which is reassuring. Again, there were no acute findings, such as a stroke, or mass or blood products. No further action is required on this test at this time, other than re-enforcing the importance of good blood pressure control, good cholesterol control, good blood sugar control, and weight management. Please remind patient to keep any upcoming appointments or tests and to call us with any interim questions, concerns, problems or updates. All in all, very minimal changes from a prior brain MRI from 2009, which is reassuring,  Thanks,  Star Age, MD, PhD    ______

## 2013-10-20 NOTE — Progress Notes (Signed)
Quick Note:  Shared MR Brain results with patient and she verbalized understanding ______

## 2013-11-04 DIAGNOSIS — Z23 Encounter for immunization: Secondary | ICD-10-CM | POA: Diagnosis not present

## 2014-02-21 ENCOUNTER — Ambulatory Visit: Payer: Medicare Other | Admitting: Neurology

## 2014-04-04 DIAGNOSIS — E118 Type 2 diabetes mellitus with unspecified complications: Secondary | ICD-10-CM | POA: Diagnosis not present

## 2014-04-04 DIAGNOSIS — E780 Pure hypercholesterolemia: Secondary | ICD-10-CM | POA: Diagnosis not present

## 2014-04-04 DIAGNOSIS — I10 Essential (primary) hypertension: Secondary | ICD-10-CM | POA: Diagnosis not present

## 2014-04-13 DIAGNOSIS — I10 Essential (primary) hypertension: Secondary | ICD-10-CM | POA: Diagnosis not present

## 2014-04-13 DIAGNOSIS — E119 Type 2 diabetes mellitus without complications: Secondary | ICD-10-CM | POA: Diagnosis not present

## 2014-04-13 DIAGNOSIS — E785 Hyperlipidemia, unspecified: Secondary | ICD-10-CM | POA: Diagnosis not present

## 2014-09-05 ENCOUNTER — Telehealth: Payer: Self-pay | Admitting: Cardiovascular Disease

## 2014-09-05 DIAGNOSIS — E780 Pure hypercholesterolemia: Secondary | ICD-10-CM | POA: Diagnosis not present

## 2014-09-05 DIAGNOSIS — R079 Chest pain, unspecified: Secondary | ICD-10-CM | POA: Diagnosis not present

## 2014-09-05 DIAGNOSIS — I10 Essential (primary) hypertension: Secondary | ICD-10-CM | POA: Diagnosis not present

## 2014-09-05 NOTE — Telephone Encounter (Signed)
Received records from [facility] for appointment on 09/13/14 with Dr Gwenlyn Found.  Records given to [facility] (medical records) for Dr Kennon Holter schedule on 09/13/14. lp

## 2014-09-12 DIAGNOSIS — E780 Pure hypercholesterolemia: Secondary | ICD-10-CM | POA: Diagnosis not present

## 2014-09-12 DIAGNOSIS — I10 Essential (primary) hypertension: Secondary | ICD-10-CM | POA: Diagnosis not present

## 2014-09-13 ENCOUNTER — Ambulatory Visit: Payer: Medicare Other | Admitting: Cardiovascular Disease

## 2014-09-29 DIAGNOSIS — Z23 Encounter for immunization: Secondary | ICD-10-CM | POA: Diagnosis not present

## 2014-10-12 DIAGNOSIS — I10 Essential (primary) hypertension: Secondary | ICD-10-CM | POA: Diagnosis not present

## 2014-10-12 DIAGNOSIS — E785 Hyperlipidemia, unspecified: Secondary | ICD-10-CM | POA: Diagnosis not present

## 2014-10-12 DIAGNOSIS — E119 Type 2 diabetes mellitus without complications: Secondary | ICD-10-CM | POA: Diagnosis not present

## 2014-10-30 DIAGNOSIS — E119 Type 2 diabetes mellitus without complications: Secondary | ICD-10-CM | POA: Diagnosis not present

## 2014-10-30 DIAGNOSIS — I10 Essential (primary) hypertension: Secondary | ICD-10-CM | POA: Diagnosis not present

## 2014-10-30 DIAGNOSIS — E785 Hyperlipidemia, unspecified: Secondary | ICD-10-CM | POA: Diagnosis not present

## 2015-03-06 DIAGNOSIS — M7062 Trochanteric bursitis, left hip: Secondary | ICD-10-CM | POA: Diagnosis not present

## 2015-03-06 DIAGNOSIS — M7061 Trochanteric bursitis, right hip: Secondary | ICD-10-CM | POA: Diagnosis not present

## 2015-04-26 DIAGNOSIS — E119 Type 2 diabetes mellitus without complications: Secondary | ICD-10-CM | POA: Diagnosis not present

## 2015-04-26 DIAGNOSIS — I10 Essential (primary) hypertension: Secondary | ICD-10-CM | POA: Diagnosis not present

## 2015-04-26 DIAGNOSIS — E785 Hyperlipidemia, unspecified: Secondary | ICD-10-CM | POA: Diagnosis not present

## 2015-04-30 DIAGNOSIS — E119 Type 2 diabetes mellitus without complications: Secondary | ICD-10-CM | POA: Diagnosis not present

## 2015-04-30 DIAGNOSIS — I10 Essential (primary) hypertension: Secondary | ICD-10-CM | POA: Diagnosis not present

## 2015-04-30 DIAGNOSIS — E785 Hyperlipidemia, unspecified: Secondary | ICD-10-CM | POA: Diagnosis not present

## 2015-10-05 DIAGNOSIS — Z23 Encounter for immunization: Secondary | ICD-10-CM | POA: Diagnosis not present

## 2015-10-30 DIAGNOSIS — E785 Hyperlipidemia, unspecified: Secondary | ICD-10-CM | POA: Diagnosis not present

## 2015-10-30 DIAGNOSIS — E119 Type 2 diabetes mellitus without complications: Secondary | ICD-10-CM | POA: Diagnosis not present

## 2015-10-30 DIAGNOSIS — I10 Essential (primary) hypertension: Secondary | ICD-10-CM | POA: Diagnosis not present

## 2016-02-15 DIAGNOSIS — I10 Essential (primary) hypertension: Secondary | ICD-10-CM | POA: Diagnosis not present

## 2016-02-15 DIAGNOSIS — E785 Hyperlipidemia, unspecified: Secondary | ICD-10-CM | POA: Diagnosis not present

## 2016-02-15 DIAGNOSIS — E119 Type 2 diabetes mellitus without complications: Secondary | ICD-10-CM | POA: Diagnosis not present

## 2016-02-15 DIAGNOSIS — D649 Anemia, unspecified: Secondary | ICD-10-CM | POA: Diagnosis not present

## 2016-03-20 DIAGNOSIS — Z0000 Encounter for general adult medical examination without abnormal findings: Secondary | ICD-10-CM | POA: Diagnosis not present

## 2016-07-01 DIAGNOSIS — I10 Essential (primary) hypertension: Secondary | ICD-10-CM | POA: Diagnosis not present

## 2016-07-01 DIAGNOSIS — M8580 Other specified disorders of bone density and structure, unspecified site: Secondary | ICD-10-CM | POA: Diagnosis not present

## 2016-07-01 DIAGNOSIS — R079 Chest pain, unspecified: Secondary | ICD-10-CM | POA: Diagnosis not present

## 2016-07-01 DIAGNOSIS — E119 Type 2 diabetes mellitus without complications: Secondary | ICD-10-CM | POA: Diagnosis not present

## 2016-07-01 DIAGNOSIS — Z23 Encounter for immunization: Secondary | ICD-10-CM | POA: Diagnosis not present

## 2016-07-01 DIAGNOSIS — D649 Anemia, unspecified: Secondary | ICD-10-CM | POA: Diagnosis not present

## 2016-07-01 DIAGNOSIS — M859 Disorder of bone density and structure, unspecified: Secondary | ICD-10-CM | POA: Diagnosis not present

## 2016-07-01 DIAGNOSIS — E785 Hyperlipidemia, unspecified: Secondary | ICD-10-CM | POA: Diagnosis not present

## 2016-07-01 DIAGNOSIS — E559 Vitamin D deficiency, unspecified: Secondary | ICD-10-CM | POA: Diagnosis not present

## 2016-07-01 DIAGNOSIS — Z7982 Long term (current) use of aspirin: Secondary | ICD-10-CM | POA: Diagnosis not present

## 2016-07-03 ENCOUNTER — Ambulatory Visit: Payer: Medicare Other | Admitting: Gastroenterology

## 2016-07-29 DIAGNOSIS — E785 Hyperlipidemia, unspecified: Secondary | ICD-10-CM | POA: Diagnosis not present

## 2016-07-29 DIAGNOSIS — E119 Type 2 diabetes mellitus without complications: Secondary | ICD-10-CM | POA: Diagnosis not present

## 2016-07-29 DIAGNOSIS — G459 Transient cerebral ischemic attack, unspecified: Secondary | ICD-10-CM | POA: Diagnosis not present

## 2016-07-29 DIAGNOSIS — I10 Essential (primary) hypertension: Secondary | ICD-10-CM | POA: Diagnosis not present

## 2016-07-29 DIAGNOSIS — R55 Syncope and collapse: Secondary | ICD-10-CM | POA: Diagnosis not present

## 2016-07-29 DIAGNOSIS — R079 Chest pain, unspecified: Secondary | ICD-10-CM | POA: Diagnosis not present

## 2016-08-01 DIAGNOSIS — R079 Chest pain, unspecified: Secondary | ICD-10-CM | POA: Diagnosis not present

## 2016-08-07 DIAGNOSIS — R079 Chest pain, unspecified: Secondary | ICD-10-CM | POA: Diagnosis not present

## 2016-08-07 DIAGNOSIS — E785 Hyperlipidemia, unspecified: Secondary | ICD-10-CM | POA: Diagnosis not present

## 2016-08-07 DIAGNOSIS — E668 Other obesity: Secondary | ICD-10-CM | POA: Diagnosis not present

## 2016-08-07 DIAGNOSIS — I119 Hypertensive heart disease without heart failure: Secondary | ICD-10-CM | POA: Diagnosis not present

## 2016-08-07 DIAGNOSIS — Z8673 Personal history of transient ischemic attack (TIA), and cerebral infarction without residual deficits: Secondary | ICD-10-CM | POA: Diagnosis not present

## 2016-10-31 DIAGNOSIS — Z23 Encounter for immunization: Secondary | ICD-10-CM | POA: Diagnosis not present

## 2017-09-25 DIAGNOSIS — Z23 Encounter for immunization: Secondary | ICD-10-CM | POA: Diagnosis not present

## 2018-08-19 ENCOUNTER — Other Ambulatory Visit: Payer: Self-pay

## 2018-09-17 DIAGNOSIS — Z23 Encounter for immunization: Secondary | ICD-10-CM | POA: Diagnosis not present

## 2019-12-19 DIAGNOSIS — I10 Essential (primary) hypertension: Secondary | ICD-10-CM | POA: Diagnosis not present

## 2019-12-19 DIAGNOSIS — E119 Type 2 diabetes mellitus without complications: Secondary | ICD-10-CM | POA: Diagnosis not present

## 2019-12-19 DIAGNOSIS — E785 Hyperlipidemia, unspecified: Secondary | ICD-10-CM | POA: Diagnosis not present

## 2019-12-19 DIAGNOSIS — E118 Type 2 diabetes mellitus with unspecified complications: Secondary | ICD-10-CM | POA: Diagnosis not present

## 2019-12-19 DIAGNOSIS — D649 Anemia, unspecified: Secondary | ICD-10-CM | POA: Diagnosis not present

## 2019-12-22 DIAGNOSIS — Z0000 Encounter for general adult medical examination without abnormal findings: Secondary | ICD-10-CM | POA: Diagnosis not present

## 2019-12-22 DIAGNOSIS — R7301 Impaired fasting glucose: Secondary | ICD-10-CM | POA: Diagnosis not present

## 2019-12-22 DIAGNOSIS — I10 Essential (primary) hypertension: Secondary | ICD-10-CM | POA: Diagnosis not present

## 2019-12-22 DIAGNOSIS — E785 Hyperlipidemia, unspecified: Secondary | ICD-10-CM | POA: Diagnosis not present

## 2019-12-22 DIAGNOSIS — R3121 Asymptomatic microscopic hematuria: Secondary | ICD-10-CM | POA: Diagnosis not present

## 2020-01-02 DIAGNOSIS — Z1211 Encounter for screening for malignant neoplasm of colon: Secondary | ICD-10-CM | POA: Diagnosis not present

## 2020-01-02 DIAGNOSIS — Z1212 Encounter for screening for malignant neoplasm of rectum: Secondary | ICD-10-CM | POA: Diagnosis not present

## 2020-01-03 DIAGNOSIS — I10 Essential (primary) hypertension: Secondary | ICD-10-CM | POA: Diagnosis not present

## 2020-01-06 LAB — COLOGUARD: COLOGUARD: POSITIVE — AB

## 2020-01-06 LAB — EXTERNAL GENERIC LAB PROCEDURE: COLOGUARD: POSITIVE — AB

## 2020-08-02 ENCOUNTER — Emergency Department (HOSPITAL_COMMUNITY): Payer: No Typology Code available for payment source

## 2020-08-02 ENCOUNTER — Other Ambulatory Visit: Payer: Self-pay

## 2020-08-02 ENCOUNTER — Emergency Department (HOSPITAL_COMMUNITY)
Admission: EM | Admit: 2020-08-02 | Discharge: 2020-08-02 | Disposition: A | Discharge status: Home / Self Care | Payer: No Typology Code available for payment source | Attending: Emergency Medicine | Admitting: Emergency Medicine

## 2020-08-02 ENCOUNTER — Encounter (HOSPITAL_COMMUNITY): Payer: Self-pay

## 2020-08-02 DIAGNOSIS — S299XXA Unspecified injury of thorax, initial encounter: Secondary | ICD-10-CM | POA: Diagnosis present

## 2020-08-02 DIAGNOSIS — I10 Essential (primary) hypertension: Secondary | ICD-10-CM | POA: Insufficient documentation

## 2020-08-02 DIAGNOSIS — R079 Chest pain, unspecified: Secondary | ICD-10-CM | POA: Diagnosis not present

## 2020-08-02 DIAGNOSIS — R519 Headache, unspecified: Secondary | ICD-10-CM | POA: Diagnosis not present

## 2020-08-02 DIAGNOSIS — Y92410 Unspecified street and highway as the place of occurrence of the external cause: Secondary | ICD-10-CM | POA: Insufficient documentation

## 2020-08-02 DIAGNOSIS — R0789 Other chest pain: Secondary | ICD-10-CM

## 2020-08-02 DIAGNOSIS — Z041 Encounter for examination and observation following transport accident: Secondary | ICD-10-CM | POA: Diagnosis not present

## 2020-08-02 DIAGNOSIS — Z79899 Other long term (current) drug therapy: Secondary | ICD-10-CM | POA: Insufficient documentation

## 2020-08-02 DIAGNOSIS — S20211A Contusion of right front wall of thorax, initial encounter: Secondary | ICD-10-CM | POA: Insufficient documentation

## 2020-08-02 DIAGNOSIS — Z7982 Long term (current) use of aspirin: Secondary | ICD-10-CM | POA: Insufficient documentation

## 2020-08-02 DIAGNOSIS — M542 Cervicalgia: Secondary | ICD-10-CM | POA: Insufficient documentation

## 2020-08-02 MED ORDER — ACETAMINOPHEN 500 MG PO TABS
1000.0000 mg | ORAL_TABLET | Freq: Once | ORAL | Status: AC
  Administered 2020-08-02: 1000 mg via ORAL
  Filled 2020-08-02: qty 2
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
## 2020-08-08 DIAGNOSIS — M5410 Radiculopathy, site unspecified: Secondary | ICD-10-CM | POA: Diagnosis not present

## 2020-08-08 DIAGNOSIS — R221 Localized swelling, mass and lump, neck: Secondary | ICD-10-CM | POA: Diagnosis not present

## 2020-08-08 DIAGNOSIS — R202 Paresthesia of skin: Secondary | ICD-10-CM | POA: Diagnosis not present

## 2020-08-08 DIAGNOSIS — I10 Essential (primary) hypertension: Secondary | ICD-10-CM | POA: Diagnosis not present

## 2020-08-10 DIAGNOSIS — R229 Localized swelling, mass and lump, unspecified: Secondary | ICD-10-CM | POA: Diagnosis not present

## 2020-08-10 DIAGNOSIS — S134XXA Sprain of ligaments of cervical spine, initial encounter: Secondary | ICD-10-CM | POA: Diagnosis not present

## 2020-08-10 DIAGNOSIS — M542 Cervicalgia: Secondary | ICD-10-CM | POA: Diagnosis not present

## 2020-08-16 DIAGNOSIS — R221 Localized swelling, mass and lump, neck: Secondary | ICD-10-CM | POA: Diagnosis not present

## 2020-09-12 DIAGNOSIS — S1093XD Contusion of unspecified part of neck, subsequent encounter: Secondary | ICD-10-CM | POA: Diagnosis not present

## 2020-09-12 DIAGNOSIS — I10 Essential (primary) hypertension: Secondary | ICD-10-CM | POA: Diagnosis not present

## 2020-09-12 DIAGNOSIS — R195 Other fecal abnormalities: Secondary | ICD-10-CM | POA: Diagnosis not present

## 2020-11-12 DIAGNOSIS — M25561 Pain in right knee: Secondary | ICD-10-CM | POA: Diagnosis not present

## 2020-11-12 DIAGNOSIS — G8929 Other chronic pain: Secondary | ICD-10-CM | POA: Diagnosis not present

## 2020-11-26 ENCOUNTER — Ambulatory Visit (INDEPENDENT_AMBULATORY_CARE_PROVIDER_SITE_OTHER): Payer: Medicare Other | Admitting: Orthopaedic Surgery

## 2020-11-26 ENCOUNTER — Ambulatory Visit: Payer: Self-pay

## 2020-11-26 ENCOUNTER — Other Ambulatory Visit: Payer: Self-pay

## 2020-11-26 VITALS — Ht 65.0 in | Wt 192.0 lb

## 2020-11-26 DIAGNOSIS — M1611 Unilateral primary osteoarthritis, right hip: Secondary | ICD-10-CM | POA: Diagnosis not present

## 2020-11-26 DIAGNOSIS — M25561 Pain in right knee: Secondary | ICD-10-CM | POA: Diagnosis not present

## 2020-11-26 DIAGNOSIS — G8929 Other chronic pain: Secondary | ICD-10-CM

## 2020-11-26 MED ORDER — METHYLPREDNISOLONE ACETATE 40 MG/ML IJ SUSP
40.0000 mg | INTRAMUSCULAR | Status: AC | PRN
  Administered 2020-11-26: 40 mg via INTRA_ARTICULAR

## 2020-11-26 MED ORDER — LIDOCAINE HCL 1 % IJ SOLN
3.0000 mL | INTRAMUSCULAR | Status: AC | PRN
  Administered 2020-11-26: 3 mL
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
## 2020-12-24 ENCOUNTER — Ambulatory Visit: Payer: Medicare Other | Admitting: Orthopaedic Surgery

## 2021-03-06 DIAGNOSIS — R195 Other fecal abnormalities: Secondary | ICD-10-CM | POA: Diagnosis not present

## 2021-03-06 DIAGNOSIS — E782 Mixed hyperlipidemia: Secondary | ICD-10-CM | POA: Diagnosis not present

## 2021-03-06 DIAGNOSIS — I10 Essential (primary) hypertension: Secondary | ICD-10-CM | POA: Diagnosis not present

## 2021-03-19 DIAGNOSIS — R195 Other fecal abnormalities: Secondary | ICD-10-CM | POA: Diagnosis not present

## 2021-03-19 DIAGNOSIS — K5730 Diverticulosis of large intestine without perforation or abscess without bleeding: Secondary | ICD-10-CM | POA: Diagnosis not present

## 2021-04-18 DIAGNOSIS — D124 Benign neoplasm of descending colon: Secondary | ICD-10-CM | POA: Diagnosis not present

## 2021-04-18 DIAGNOSIS — K635 Polyp of colon: Secondary | ICD-10-CM | POA: Diagnosis not present

## 2021-04-18 DIAGNOSIS — Z1211 Encounter for screening for malignant neoplasm of colon: Secondary | ICD-10-CM | POA: Diagnosis not present

## 2021-04-18 DIAGNOSIS — R195 Other fecal abnormalities: Secondary | ICD-10-CM | POA: Diagnosis not present

## 2021-10-24 DIAGNOSIS — E7800 Pure hypercholesterolemia, unspecified: Secondary | ICD-10-CM | POA: Diagnosis not present

## 2021-10-24 DIAGNOSIS — E785 Hyperlipidemia, unspecified: Secondary | ICD-10-CM | POA: Diagnosis not present

## 2021-10-24 DIAGNOSIS — I10 Essential (primary) hypertension: Secondary | ICD-10-CM | POA: Diagnosis not present

## 2021-10-29 DIAGNOSIS — E118 Type 2 diabetes mellitus with unspecified complications: Secondary | ICD-10-CM | POA: Diagnosis not present

## 2021-10-29 DIAGNOSIS — I700 Atherosclerosis of aorta: Secondary | ICD-10-CM | POA: Diagnosis not present

## 2021-10-29 DIAGNOSIS — D509 Iron deficiency anemia, unspecified: Secondary | ICD-10-CM | POA: Diagnosis not present

## 2021-10-29 DIAGNOSIS — Z23 Encounter for immunization: Secondary | ICD-10-CM | POA: Diagnosis not present

## 2021-10-29 DIAGNOSIS — M8580 Other specified disorders of bone density and structure, unspecified site: Secondary | ICD-10-CM | POA: Diagnosis not present

## 2021-10-29 DIAGNOSIS — I10 Essential (primary) hypertension: Secondary | ICD-10-CM | POA: Diagnosis not present

## 2021-10-29 DIAGNOSIS — F419 Anxiety disorder, unspecified: Secondary | ICD-10-CM | POA: Diagnosis not present

## 2021-10-29 DIAGNOSIS — Z0000 Encounter for general adult medical examination without abnormal findings: Secondary | ICD-10-CM | POA: Diagnosis not present

## 2021-10-29 DIAGNOSIS — E785 Hyperlipidemia, unspecified: Secondary | ICD-10-CM | POA: Diagnosis not present

## 2021-10-30 ENCOUNTER — Other Ambulatory Visit (HOSPITAL_BASED_OUTPATIENT_CLINIC_OR_DEPARTMENT_OTHER): Payer: Self-pay | Admitting: Internal Medicine

## 2021-10-30 DIAGNOSIS — I159 Secondary hypertension, unspecified: Secondary | ICD-10-CM

## 2021-12-05 ENCOUNTER — Encounter (HOSPITAL_BASED_OUTPATIENT_CLINIC_OR_DEPARTMENT_OTHER): Payer: Self-pay

## 2021-12-05 ENCOUNTER — Ambulatory Visit (HOSPITAL_BASED_OUTPATIENT_CLINIC_OR_DEPARTMENT_OTHER)
Admission: RE | Admit: 2021-12-05 | Discharge: 2021-12-05 | Disposition: A | Discharge status: Home / Self Care | Payer: Medicare Other | Source: Ambulatory Visit | Attending: Internal Medicine | Admitting: Internal Medicine

## 2021-12-05 ENCOUNTER — Encounter (HOSPITAL_BASED_OUTPATIENT_CLINIC_OR_DEPARTMENT_OTHER): Payer: Self-pay | Admitting: Internal Medicine

## 2021-12-05 DIAGNOSIS — I159 Secondary hypertension, unspecified: Secondary | ICD-10-CM

## 2021-12-16 DIAGNOSIS — D509 Iron deficiency anemia, unspecified: Secondary | ICD-10-CM | POA: Diagnosis not present

## 2021-12-16 DIAGNOSIS — M8580 Other specified disorders of bone density and structure, unspecified site: Secondary | ICD-10-CM | POA: Diagnosis not present

## 2021-12-17 DIAGNOSIS — R911 Solitary pulmonary nodule: Secondary | ICD-10-CM | POA: Diagnosis not present

## 2021-12-17 DIAGNOSIS — E559 Vitamin D deficiency, unspecified: Secondary | ICD-10-CM | POA: Diagnosis not present

## 2022-01-23 ENCOUNTER — Institutional Professional Consult (permissible substitution): Payer: Medicare Other | Admitting: Pulmonary Disease

## 2022-03-10 ENCOUNTER — Encounter: Payer: Self-pay | Admitting: Pulmonary Disease

## 2022-03-10 ENCOUNTER — Ambulatory Visit (INDEPENDENT_AMBULATORY_CARE_PROVIDER_SITE_OTHER): Payer: Medicare Other | Admitting: Pulmonary Disease

## 2022-03-10 VITALS — BP 120/80 | HR 82 | Ht 64.0 in | Wt 192.0 lb

## 2022-03-10 DIAGNOSIS — Z789 Other specified health status: Secondary | ICD-10-CM

## 2022-03-10 DIAGNOSIS — R911 Solitary pulmonary nodule: Secondary | ICD-10-CM | POA: Diagnosis not present

## 2022-03-10 NOTE — Patient Instructions (Signed)
Thank you for visiting Dr. Valeta Harms at [facility]. Today we recommend the following:  Orders Placed This Encounter  Procedures   CT Chest Wo Contrast   Follow up with [place] after your ct chest complete   Return in about 3 months (around 06/08/2022) for with Eric Form, NP, or Dr. Valeta Harms.    Please do your part to reduce the spread of COVID-19.

## 2022-03-10 NOTE — Progress Notes (Signed)
Synopsis: Referred in Feb 2024 for lung nodule by Deland Pretty, MD  Subjective:   PATIENT ID: Sherri Torres GENDER: female DOB: 1946-01-04, MRN: [id number]  Chief Complaint  Patient presents with   Consult    Lung nodule    This is a 77 year old female, past medical history of stroke, hypertension, depression.  Patient is a lifelong non-smoker.She had a cardiac scoring CT completed in November 2023.  Patient was referred for evaluation after a 6 mm nodule was found in the left lower lobe of the lung.  Mother has a history of ovarian cancer, father with a history of throat cancer.    Past Medical History:  Diagnosis Date   Depression    Hypertension    Nosebleed    Stroke [facility])      Family History  Problem Relation Age of Onset   Cancer - Ovarian Mother    Cancer Father 85       throat cancer     Past Surgical History:  Procedure Laterality Date   ABDOMINAL HYSTERECTOMY  1991   angio plastic  1982   BACK SURGERY  2006   lower   INTESTINAL BYPASS  1998   ROTATOR CUFF REPAIR Bilateral    2001,2005,2009    Social History   Socioeconomic History   Marital status: Married    Spouse name: Jeneen Rinks   Number of children: 1   Years of education: 12   Highest education level: Not on file  Occupational History    Comment: retired  Tobacco Use   Smoking status: Never   Smokeless tobacco: Never  Vaping Use   Vaping Use: Never used  Substance and Sexual Activity   Alcohol use: No   Drug use: No   Sexual activity: Not on file  Other Topics Concern   Not on file  Social History Narrative   Patient is right handed and resides with husband.  She consumes no caffeine   Social Determinants of Radio broadcast assistant Strain: Not on file  Food Insecurity: Not on file  Transportation Needs: Not on file  Physical Activity: Not on file  Stress: Not on file  Social Connections: Not on file  Intimate Partner Violence: Not on file     Allergies  Allergen Reactions    Oxycodone Itching   Dilantin [Phenytoin] Rash     Outpatient Medications Prior to Visit  Medication Sig Dispense Refill   amLODipine-olmesartan (AZOR) 10-40 MG per tablet Take 1 tablet by mouth daily.     aspirin 81 MG tablet Take 81 mg by mouth at bedtime.      atorvastatin (LIPITOR) 20 MG tablet Take 20 mg by mouth daily.     Calcium Citrate-Vitamin D (CALCITRATE/VITAMIN D PO) Take 1 tablet by mouth daily.     Cholecalciferol (VITAMIN D) 2000 UNITS CAPS Take 2,000 Units by mouth daily.     ibuprofen (ADVIL,MOTRIN) 200 MG tablet Take 400 mg by mouth every 6 (six) hours as needed for moderate pain.     irbesartan (AVAPRO) 300 MG tablet Take 300 mg by mouth daily.     Multiple Vitamin (MULTIVITAMIN WITH MINERALS) TABS tablet Take 1 tablet by mouth daily.     venlafaxine XR (EFFEXOR-XR) 150 MG 24 hr capsule Take 150 mg by mouth daily with breakfast.      simvastatin (ZOCOR) 20 MG tablet Take 10 mg by mouth every evening.  (Patient not taking: Reported on 03/10/2022)     No facility-administered medications  prior to visit.    Review of Systems  Constitutional:  Negative for chills, fever, malaise/fatigue and weight loss.  HENT:  Negative for hearing loss, sore throat and tinnitus.   Eyes:  Negative for blurred vision and double vision.  Respiratory:  Negative for cough, hemoptysis, sputum production, shortness of breath, wheezing and stridor.   Cardiovascular:  Negative for chest pain, palpitations, orthopnea, leg swelling and PND.  Gastrointestinal:  Negative for abdominal pain, constipation, diarrhea, heartburn, nausea and vomiting.  Genitourinary:  Negative for dysuria, hematuria and urgency.  Musculoskeletal:  Negative for joint pain and myalgias.  Skin:  Negative for itching and rash.  Neurological:  Negative for dizziness, tingling, weakness and headaches.  Endo/Heme/Allergies:  Negative for environmental allergies. Does not bruise/bleed easily.  Psychiatric/Behavioral:  Negative  for depression. The patient is not nervous/anxious and does not have insomnia.   All other systems reviewed and are negative.    Objective:  Physical Exam Vitals reviewed.  Constitutional:      General: She is not in acute distress.    Appearance: She is well-developed. She is obese.  HENT:     Head: Normocephalic and atraumatic.  Eyes:     General: No scleral icterus.    Conjunctiva/sclera: Conjunctivae normal.     Pupils: Pupils are equal, round, and reactive to light.  Neck:     Vascular: No JVD.     Trachea: No tracheal deviation.  Cardiovascular:     Rate and Rhythm: Normal rate and regular rhythm.     Heart sounds: Normal heart sounds. No murmur heard. Pulmonary:     Effort: Pulmonary effort is normal. No tachypnea, accessory muscle usage or respiratory distress.     Breath sounds: No stridor. No wheezing, rhonchi or rales.  Abdominal:     General: There is no distension.     Palpations: Abdomen is soft.     Tenderness: There is no abdominal tenderness.  Musculoskeletal:        General: No tenderness.     Cervical back: Neck supple.  Lymphadenopathy:     Cervical: No cervical adenopathy.  Skin:    General: Skin is warm and dry.     Capillary Refill: Capillary refill takes less than 2 seconds.     Findings: No rash.  Neurological:     Mental Status: She is alert and oriented to person, place, and time.  Psychiatric:        Behavior: Behavior normal.      Vitals:   03/10/22 0958  BP: 120/80  Pulse: 82  SpO2: 98%  Weight: 192 lb (87.1 kg)  Height: 5' 4"$  (1.626 m)   98% on RA BMI Readings from Last 3 Encounters:  03/10/22 32.96 kg/m  11/26/20 31.95 kg/m  08/02/20 30.62 kg/m   Wt Readings from Last 3 Encounters:  03/10/22 192 lb (87.1 kg)  11/26/20 192 lb (87.1 kg)  08/02/20 184 lb (83.5 kg)     CBC    Component Value Date/Time   WBC 6.7 09/14/2013 1751   RBC 4.91 09/14/2013 1751   HGB 12.0 09/14/2013 1751   HCT 36.9 09/14/2013 1751   PLT  245 09/14/2013 1751   MCV 75.2 (L) 09/14/2013 1751   MCH 24.4 (L) 09/14/2013 1751   MCHC 32.5 09/14/2013 1751   RDW 13.2 09/14/2013 1751   LYMPHSABS 2.0 10/19/2007 1845   MONOABS 0.3 10/19/2007 1845   EOSABS 0.0 10/19/2007 1845   BASOSABS 0.0 10/19/2007 1845     Chest  Imaging: November 2023 cardiac scoring CT: 6 mm incidental pulmonary nodule The patient's images have been independently reviewed by me.    Pulmonary Functions Testing Results:     No data to display          FeNO:   Pathology:   Echocardiogram:   Heart Catheterization:     Assessment & Plan:     ICD-10-CM   1. Nodule of lower lobe of left lung  R91.1     2. Non-smoker  Z78.9       Discussion:  This is a 77 year old female, left lower lobe pulmonary nodule found incidentally on cardiac scoring CT in November 2023.  Patient is a lifelong non-smoker.  Plan: Today in the office with done about restratification for the development of malignancy. We talked about the size of the lesion and location. Based on risk stratification short-term CT follow-up I think is appropriate in this situation. We will have a repeat noncontrasted CT scan of the chest in 6 months. Repeat CT chest will be in May 2024. Patient to follow-up with [place] after repeat CT is complete.   Current Outpatient Medications:    amLODipine-olmesartan (AZOR) 10-40 MG per tablet, Take 1 tablet by mouth daily., Disp: , Rfl:    aspirin 81 MG tablet, Take 81 mg by mouth at bedtime. , Disp: , Rfl:    atorvastatin (LIPITOR) 20 MG tablet, Take 20 mg by mouth daily., Disp: , Rfl:    Calcium Citrate-Vitamin D (CALCITRATE/VITAMIN D PO), Take 1 tablet by mouth daily., Disp: , Rfl:    Cholecalciferol (VITAMIN D) 2000 UNITS CAPS, Take 2,000 Units by mouth daily., Disp: , Rfl:    ibuprofen (ADVIL,MOTRIN) 200 MG tablet, Take 400 mg by mouth every 6 (six) hours as needed for moderate pain., Disp: , Rfl:    irbesartan (AVAPRO) 300 MG tablet, Take 300 mg  by mouth daily., Disp: , Rfl:    Multiple Vitamin (MULTIVITAMIN WITH MINERALS) TABS tablet, Take 1 tablet by mouth daily., Disp: , Rfl:    venlafaxine XR (EFFEXOR-XR) 150 MG 24 hr capsule, Take 150 mg by mouth daily with breakfast. , Disp: , Rfl:    simvastatin (ZOCOR) 20 MG tablet, Take 10 mg by mouth every evening.  (Patient not taking: Reported on 03/10/2022), Disp: , Rfl:   Garner Nash, DO [facility] 03/10/2022 10:27 AM

## 2022-06-04 ENCOUNTER — Ambulatory Visit (HOSPITAL_BASED_OUTPATIENT_CLINIC_OR_DEPARTMENT_OTHER): Payer: No Typology Code available for payment source

## 2022-07-01 ENCOUNTER — Ambulatory Visit (HOSPITAL_BASED_OUTPATIENT_CLINIC_OR_DEPARTMENT_OTHER)
Admission: RE | Admit: 2022-07-01 | Discharge: 2022-07-01 | Disposition: A | Discharge status: Home / Self Care | Payer: Medicare Other | Source: Ambulatory Visit | Attending: Pulmonary Disease | Admitting: Pulmonary Disease

## 2022-07-01 DIAGNOSIS — R918 Other nonspecific abnormal finding of lung field: Secondary | ICD-10-CM | POA: Diagnosis not present

## 2022-07-01 DIAGNOSIS — Z789 Other specified health status: Secondary | ICD-10-CM | POA: Insufficient documentation

## 2022-07-01 DIAGNOSIS — R911 Solitary pulmonary nodule: Secondary | ICD-10-CM | POA: Insufficient documentation

## 2022-07-18 DIAGNOSIS — M5441 Lumbago with sciatica, right side: Secondary | ICD-10-CM | POA: Diagnosis not present

## 2022-07-20 NOTE — Progress Notes (Signed)
CT chest reviewed. Nodule is slightly bigger. Patient needs a repeat CT chest in 6 months with follow up appt with SG after ct complete   Thanks,  BLI  Josephine Igo, DO [facility] 07/20/2022 10:04 AM

## 2022-07-21 ENCOUNTER — Other Ambulatory Visit: Payer: Self-pay | Admitting: Emergency Medicine

## 2022-07-21 DIAGNOSIS — R911 Solitary pulmonary nodule: Secondary | ICD-10-CM

## 2022-10-07 ENCOUNTER — Ambulatory Visit: Payer: Medicare Other | Admitting: Acute Care

## 2022-10-07 ENCOUNTER — Encounter: Payer: Self-pay | Admitting: Acute Care

## 2022-10-07 NOTE — Progress Notes (Deleted)
History of Present Illness Sherri Torres is a 77 y.o. female never smoker referred to see Dr. Tonia Brooms 02/2022 by Dr. Renne Crigler for a lung nodule.    10/07/2022 Pt. Presents for follow up . She was due to have a CT Chest 09/2022. Previous CT 06/2022 showed the nodule of concern in the left lower lobe had grown. Plan was for a 6 month follow up. She has not had this Ct Chest done. She is stable   Test Results:     Latest Ref Rng & Units 09/14/2013    5:51 PM 05/27/2012    1:54 PM 09/07/2008   11:05 AM  CBC  WBC 4.0 - 10.5 K/uL 6.7  5.4  5.3   Hemoglobin 12.0 - 15.0 g/dL 16.1  09.6  04.5   Hematocrit 36.0 - 46.0 % 36.9  37.7  36.6   Platelets 150 - 400 K/uL 245  240  255        Latest Ref Rng & Units 09/14/2013    5:51 PM 09/07/2008   11:05 AM 10/21/2007    6:41 AM  BMP  Glucose 70 - 99 mg/dL 409  811  914   BUN 6 - 23 mg/dL 17  12  6    Creatinine 0.50 - 1.10 mg/dL 7.82  9.56  2.13   Sodium 137 - 147 mEq/L 137  138  133   Potassium 3.7 - 5.3 mEq/L 4.1  4.5  3.2   Chloride 96 - 112 mEq/L 99  104  100   CO2 19 - 32 mEq/L 24  28  25    Calcium 8.4 - 10.5 mg/dL 9.5  9.5  9.1     BNP No results found for: "BNP"  ProBNP No results found for: "PROBNP"  PFT No results found for: "FEV1PRE", "FEV1POST", "FVCPRE", "FVCPOST", "TLC", "DLCOUNC", "PREFEV1FVCRT", "PSTFEV1FVCRT"  No results found.   Past medical hx Past Medical History:  Diagnosis Date   Depression    Hypertension    Nosebleed    Stroke [facility])      Social History   Tobacco Use   Smoking status: Never   Smokeless tobacco: Never  Vaping Use   Vaping status: Never Used  Substance Use Topics   Alcohol use: No   Drug use: No    Ms.Liotta reports that she has never smoked. She has never used smokeless tobacco. She reports that she does not drink alcohol and does not use drugs.  Tobacco Cessation: Counseling given: Not Answered   Past surgical hx, Family hx, Social hx all reviewed.  Current Outpatient Medications  on File Prior to Visit  Medication Sig   amLODipine-olmesartan (AZOR) 10-40 MG per tablet Take 1 tablet by mouth daily.   aspirin 81 MG tablet Take 81 mg by mouth at bedtime.    atorvastatin (LIPITOR) 20 MG tablet Take 20 mg by mouth daily.   Calcium Citrate-Vitamin D (CALCITRATE/VITAMIN D PO) Take 1 tablet by mouth daily.   Cholecalciferol (VITAMIN D) 2000 UNITS CAPS Take 2,000 Units by mouth daily.   ibuprofen (ADVIL,MOTRIN) 200 MG tablet Take 400 mg by mouth every 6 (six) hours as needed for moderate pain.   irbesartan (AVAPRO) 300 MG tablet Take 300 mg by mouth daily.   Multiple Vitamin (MULTIVITAMIN WITH MINERALS) TABS tablet Take 1 tablet by mouth daily.   simvastatin (ZOCOR) 20 MG tablet Take 10 mg by mouth every evening.   venlafaxine XR (EFFEXOR-XR) 150 MG 24 hr capsule Take 150 mg by mouth daily  with breakfast.    No current facility-administered medications on file prior to visit.     Allergies  Allergen Reactions   Oxycodone Itching   Dilantin [Phenytoin] Rash    Review Of Systems:  Constitutional:   No  weight loss, night sweats,  Fevers, chills, fatigue, or  lassitude.  HEENT:   No headaches,  Difficulty swallowing,  Tooth/dental problems, or  Sore throat,                No sneezing, itching, ear ache, nasal congestion, post nasal drip,   CV:  No chest pain,  Orthopnea, PND, swelling in lower extremities, anasarca, dizziness, palpitations, syncope.   GI  No heartburn, indigestion, abdominal pain, nausea, vomiting, diarrhea, change in bowel habits, loss of appetite, bloody stools.   Resp: No shortness of breath with exertion or at rest.  No excess mucus, no productive cough,  No non-productive cough,  No coughing up of blood.  No change in color of mucus.  No wheezing.  No chest wall deformity  Skin: no rash or lesions.  GU: no dysuria, change in color of urine, no urgency or frequency.  No flank pain, no hematuria   MS:  No joint pain or swelling.  No decreased  range of motion.  No back pain.  Psych:  No change in mood or affect. No depression or anxiety.  No memory loss.   Vital Signs BP 136/74 (BP Location: Right Arm, Cuff Size: Large)   Pulse 87   Temp 98.4 F (36.9 C) (Temporal)   Ht 5\' 4"  (1.626 m)   Wt 198 lb 6.4 oz (90 kg)   SpO2 99%   BMI 34.06 kg/m    Physical Exam:  General- No distress,  A&Ox3 ENT: No sinus tenderness, TM clear, pale nasal mucosa, no oral exudate,no post nasal drip, no LAN Cardiac: S1, S2, regular rate and rhythm, no murmur Chest: No wheeze/ rales/ dullness; no accessory muscle use, no nasal flaring, no sternal retractions Abd.: Soft Non-tender Ext: No clubbing cyanosis, edema Neuro:  normal strength Skin: No rashes, warm and dry Psych: normal mood and behavior   Assessment/Plan  No problem-specific Assessment & Plan notes found for this encounter.    Bevelyn Ngo, NP 10/07/2022  9:43 AM

## 2022-10-29 DIAGNOSIS — I10 Essential (primary) hypertension: Secondary | ICD-10-CM | POA: Diagnosis not present

## 2022-10-29 DIAGNOSIS — R7301 Impaired fasting glucose: Secondary | ICD-10-CM | POA: Diagnosis not present

## 2022-10-29 DIAGNOSIS — E785 Hyperlipidemia, unspecified: Secondary | ICD-10-CM | POA: Diagnosis not present

## 2022-11-03 DIAGNOSIS — M5431 Sciatica, right side: Secondary | ICD-10-CM | POA: Diagnosis not present

## 2022-11-03 DIAGNOSIS — I10 Essential (primary) hypertension: Secondary | ICD-10-CM | POA: Diagnosis not present

## 2022-11-03 DIAGNOSIS — Z0000 Encounter for general adult medical examination without abnormal findings: Secondary | ICD-10-CM | POA: Diagnosis not present

## 2022-11-03 DIAGNOSIS — M8580 Other specified disorders of bone density and structure, unspecified site: Secondary | ICD-10-CM | POA: Diagnosis not present

## 2022-11-03 DIAGNOSIS — I700 Atherosclerosis of aorta: Secondary | ICD-10-CM | POA: Diagnosis not present

## 2022-11-03 DIAGNOSIS — B351 Tinea unguium: Secondary | ICD-10-CM | POA: Diagnosis not present

## 2022-11-03 DIAGNOSIS — E118 Type 2 diabetes mellitus with unspecified complications: Secondary | ICD-10-CM | POA: Diagnosis not present

## 2022-11-03 DIAGNOSIS — F419 Anxiety disorder, unspecified: Secondary | ICD-10-CM | POA: Diagnosis not present

## 2022-11-03 DIAGNOSIS — R911 Solitary pulmonary nodule: Secondary | ICD-10-CM | POA: Diagnosis not present

## 2022-11-03 DIAGNOSIS — I2510 Atherosclerotic heart disease of native coronary artery without angina pectoris: Secondary | ICD-10-CM | POA: Diagnosis not present

## 2022-11-06 ENCOUNTER — Ambulatory Visit: Payer: Medicare Other | Admitting: Family Medicine

## 2022-11-06 ENCOUNTER — Encounter: Payer: Self-pay | Admitting: Family Medicine

## 2022-11-06 ENCOUNTER — Ambulatory Visit: Payer: Medicare Other

## 2022-11-06 VITALS — BP 174/90 | HR 72 | Ht 64.0 in | Wt 199.0 lb

## 2022-11-06 DIAGNOSIS — M25551 Pain in right hip: Secondary | ICD-10-CM | POA: Diagnosis not present

## 2022-11-06 DIAGNOSIS — M5441 Lumbago with sciatica, right side: Secondary | ICD-10-CM | POA: Diagnosis not present

## 2022-11-06 DIAGNOSIS — G8929 Other chronic pain: Secondary | ICD-10-CM | POA: Insufficient documentation

## 2022-11-06 DIAGNOSIS — M7061 Trochanteric bursitis, right hip: Secondary | ICD-10-CM | POA: Diagnosis not present

## 2022-11-06 DIAGNOSIS — M545 Low back pain, unspecified: Secondary | ICD-10-CM | POA: Diagnosis not present

## 2022-11-06 DIAGNOSIS — M5136 Other intervertebral disc degeneration, lumbar region with discogenic back pain only: Secondary | ICD-10-CM | POA: Diagnosis not present

## 2022-11-06 DIAGNOSIS — M1611 Unilateral primary osteoarthritis, right hip: Secondary | ICD-10-CM | POA: Diagnosis not present

## 2022-11-06 MED ORDER — GABAPENTIN 100 MG PO CAPS
100.0000 mg | ORAL_CAPSULE | Freq: Every day | ORAL | 3 refills | Status: DC
Start: 2022-11-06 — End: 2023-10-13

## 2022-11-06 MED ORDER — PREDNISONE 50 MG PO TABS: ORAL_TABLET | ORAL | 0 refills | Status: DC

## 2022-11-06 NOTE — Patient Instructions (Addendum)
Thank you for coming in today.   I've referred you to Physical Therapy.  Let us know if you don't hear from them in one week. Lolita Patella sent a prescription for Gabapentin and Prednisone to your pharmacy.   Check back in 6 weeks

## 2022-11-06 NOTE — Progress Notes (Signed)
I, Stevenson Clinch, CMA acting as a scribe for Clementeen Graham, MD.  Sherri Torres is a 77 y.o. female who presents to [facility] today for R buttock pain progressively worsening over the last 2+ months. Hx of of low back surgery w/ Dr. Shelle Iron. Pt locates pain to the R LE. Notes pain in the right ankle, worse when first waking up and after going to the gym. Notes n/t in the leg when standing. Gets ice pick sensation in the lower leg. Also notes a knot on the top of the right foot. Sx causing night disturbance. Feels like she is sitting on a bone. Was working out at [employer] daily prior to injury.   Low back pain: right-sided Radiates: glute, hip, groin, R LE Aggravates: sitting, sit-to-stand, exercise Treatments tried: exercise, IBU, Arnica Gel  Pertinent review of systems: No fevers or chills  Relevant historical information: Hypertension   Exam:  BP (!) 174/90   Pulse 72   Ht 5\' 4"  (1.626 m)   Wt 199 lb (90.3 kg)   SpO2 99%   BMI 34.16 kg/m  General: Well Developed, well nourished, and in no acute distress.   MSK: L-spine: Normal appearing Nontender palpation spinal midline. Normal lumbar motion. Lower extremity strength is intact except noted below. Reflexes are intact. Negative slump test and straight leg raise test right.  Right hip normal-appearing Normal motion. Tender palpation greater trochanter. Hip abduction and external rotation strength are mildly diminished 4/5.    Lab and Radiology Results  X-ray images L-spine and right hip obtained today personally and independently interpreted  L-spine: Mild degenerative changes lumbar spine.  No acute fractures are visible.  Right hip: Minimal to no hip osteoarthritis.  No acute fractures are visible.  Await formal radiology review    Assessment and Plan: 77 y.o. female with right leg pain.  Pain radiates down the right leg in an L5 dermatomal pattern.  Plan for trial of prednisone  and gabapentin and trial of physical therapy.  Recheck in 6 weeks if not improved consider MRI.  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  Again physical therapy.  We talked about steroid injections and decided to wait on injection today and proceed with physical therapy and trial oral medications as above.  Recheck 6 weeks.   PDMP not reviewed this encounter. Orders Placed This Encounter  Procedures   DG Lumbar Spine 2-3 Views    Standing Status:   Future    Number of Occurrences:   1    Standing Expiration Date:   12/07/2022    Order Specific Question:   Reason for Exam (SYMPTOM  OR DIAGNOSIS REQUIRED)    Answer:   low back pain    Order Specific Question:   Preferred imaging location?    Answer:   [facility]   DG HIP UNILAT W OR W/O PELVIS 2-3 VIEWS RIGHT    Standing Status:   Future    Number of Occurrences:   1    Standing Expiration Date:   12/07/2022    Order Specific Question:   Reason for Exam (SYMPTOM  OR DIAGNOSIS REQUIRED)    Answer:   low back, right hip, and RLE pain    Order Specific Question:   Preferred imaging location?    Answer:   [facility]   Ambulatory referral to Physical Therapy    Referral Priority:   Routine  Referral Type:   Physical Medicine    Referral Reason:   Specialty Services Required    Requested Specialty:   Physical Therapy    Number of Visits Requested:   1   Meds ordered this encounter  Medications   gabapentin (NEURONTIN) 100 MG capsule    Sig: Take 1-3 capsules (100-300 mg total) by mouth at bedtime.    Dispense:  30 capsule    Refill:  3   predniSONE (DELTASONE) 50 MG tablet    Sig: Take 1 pill daily for 5 days    Dispense:  5 tablet    Refill:  0     Discussed warning signs or symptoms. Please see discharge instructions. Patient expresses understanding.   The above documentation has been reviewed and is accurate and complete Clementeen Graham, M.D.

## 2022-11-26 ENCOUNTER — Ambulatory Visit: Payer: Medicare Other | Attending: Family Medicine

## 2022-11-28 DIAGNOSIS — M8589 Other specified disorders of bone density and structure, multiple sites: Secondary | ICD-10-CM | POA: Diagnosis not present

## 2022-12-01 NOTE — Progress Notes (Signed)
Right hip x-ray shows medium arthritis.

## 2022-12-01 NOTE — Progress Notes (Signed)
Low back x-ray shows arthritis throughout the low back.

## 2022-12-10 DIAGNOSIS — E1165 Type 2 diabetes mellitus with hyperglycemia: Secondary | ICD-10-CM | POA: Diagnosis not present

## 2023-01-07 ENCOUNTER — Ambulatory Visit
Admission: RE | Admit: 2023-01-07 | Discharge: 2023-01-07 | Disposition: A | Discharge status: Home / Self Care | Payer: Medicare Other | Source: Ambulatory Visit | Attending: Pulmonary Disease | Admitting: Pulmonary Disease

## 2023-01-07 DIAGNOSIS — R911 Solitary pulmonary nodule: Secondary | ICD-10-CM

## 2023-01-19 NOTE — Progress Notes (Signed)
 History of Present Illness Sherri Torres is a 77 y.o. female with past medical history of stroke, hypertension, depression. Patient is a lifelong non-smoker.She had a cardiac scoring CT completed in November 2023. Patient was referred for evaluation after a 6 mm nodule was found in the left lower lobe of the lung. Mother has a history of ovarian cancer, father with a history of throat cancer. She was followed by Dr. Brenna   01/20/2023 Pt. presents for follow up with her husband after 6 month follow up CT chest to re-evaluate a 6 mm pulmonary nodule. She is a never smoker but has family history of both ovarian and throat cancer.  She states she has been doing well. No pulmonary issues or complaints.  We have reviewed the results of her CT Chest. This shows the nodule is stable, and per Fleischner guidelines she has option of a 12 month follow up scan. I discussed what patient would prefer, and she states she would like to do a 12 month follow up with her family history. I have placed the order. [place] the patient or her husband have any questions at completion of the visit.   Test Results: 01/16/2023 CT Chest  Lungs/Pleura: No pleural effusion. No pneumothorax. 6 mm pulmonary nodule, lateral left lower lobe (Im92,Se4) previously 7 mm. Lungs otherwise clear.   Upper Abdomen: Cholecystectomy clips. Aortic Atherosclerosis (ICD10-170.0). No acute findings.   Musculoskeletal: Vertebral endplate spurring at multiple levels in the mid and lower thoracic spine.   IMPRESSION: 1. 6 mm left lower lobe pulmonary nodule, previously 7 mm. Non-contrast chest CT 12 months (from today's scan) is considered optional for low-risk patients, but is recommended for high-risk patients. This recommendation follows the consensus statement: Guidelines for Management of Incidental Pulmonary Nodules Detected on CT Images: From the Fleischner Society 2017; Radiology 2017; 284:228-243. 2.  Aortic Atherosclerosis  (ICD10-I70.0).     Latest Ref Rng & Units 09/14/2013    5:51 PM 05/27/2012    1:54 PM 09/07/2008   11:05 AM  CBC  WBC 4.0 - 10.5 K/uL 6.7  5.4  5.3   Hemoglobin 12.0 - 15.0 g/dL 87.9  87.9  88.3   Hematocrit 36.0 - 46.0 % 36.9  37.7  36.6   Platelets 150 - 400 K/uL 245  240  255        Latest Ref Rng & Units 09/14/2013    5:51 PM 09/07/2008   11:05 AM 10/21/2007    6:41 AM  BMP  Glucose 70 - 99 mg/dL 892  890  835   BUN 6 - 23 mg/dL 17  12  6    Creatinine 0.50 - 1.10 mg/dL 9.26  9.28  9.09   Sodium 137 - 147 mEq/L 137  138  133   Potassium 3.7 - 5.3 mEq/L 4.1  4.5  3.2   Chloride 96 - 112 mEq/L 99  104  100   CO2 19 - 32 mEq/L 24  28  25    Calcium 8.4 - 10.5 mg/dL 9.5  9.5  9.1     BNP No results found for: BNP  ProBNP No results found for: PROBNP  PFT No results found for: FEV1PRE, FEV1POST, FVCPRE, FVCPOST, TLC, DLCOUNC, PREFEV1FVCRT, PSTFEV1FVCRT  CT Chest Wo Contrast Result Date: 01/16/2023 CLINICAL DATA:  Lung nodule follow-up EXAM: CT CHEST WITHOUT CONTRAST TECHNIQUE: Multidetector CT imaging of the chest was performed following the standard protocol without IV contrast. RADIATION DOSE REDUCTION: This exam was performed according to the departmental  dose-optimization program which includes automated exposure control, adjustment of the mA and/or kV according to patient size and/or use of iterative reconstruction technique. COMPARISON:  07/01/2022 FINDINGS: Cardiovascular: Heart size normal. No pericardial effusion. Scattered plaque in the thoracic aorta without aneurysm. Mediastinum/Nodes: No mass or adenopathy. Lungs/Pleura: No pleural effusion. No pneumothorax. 6 mm pulmonary nodule, lateral left lower lobe (Im92,Se4) previously 7 mm. Lungs otherwise clear. Upper Abdomen: Cholecystectomy clips. Aortic Atherosclerosis (ICD10-170.0). No acute findings. Musculoskeletal: Vertebral endplate spurring at multiple levels in the mid and lower thoracic spine.  IMPRESSION: 1. 6 mm left lower lobe pulmonary nodule, previously 7 mm. Non-contrast chest CT 12 months (from today's scan) is considered optional for low-risk patients, but is recommended for high-risk patients. This recommendation follows the consensus statement: Guidelines for Management of Incidental Pulmonary Nodules Detected on CT Images: From the Fleischner Society 2017; Radiology 2017; 284:228-243. 2.  Aortic Atherosclerosis (ICD10-I70.0). Electronically Signed   By: JONETTA Faes M.D.   On: 01/16/2023 13:18     Past medical hx Past Medical History:  Diagnosis Date   Depression    Hypertension    Nosebleed    Stroke [facility])      Social History   Tobacco Use   Smoking status: Never   Smokeless tobacco: Never  Vaping Use   Vaping status: Never Used  Substance Use Topics   Alcohol use: No   Drug use: No    [name] reports that she has never smoked. She has never used smokeless tobacco. She reports that she does not drink alcohol and does not use drugs.  Never smoker   Past surgical hx, Family hx, Social hx all reviewed.  Current Outpatient Medications on File Prior to Visit  Medication Sig   amLODipine-olmesartan (AZOR) 10-40 MG per tablet Take 1 tablet by mouth daily.   aspirin 81 MG tablet Take 81 mg by mouth at bedtime.    atorvastatin (LIPITOR) 20 MG tablet Take 20 mg by mouth daily.   Calcium Citrate-Vitamin D (CALCITRATE/VITAMIN D PO) Take 1 tablet by mouth daily.   Cholecalciferol (VITAMIN D) 2000 UNITS CAPS Take 2,000 Units by mouth daily.   gabapentin  (NEURONTIN ) 100 MG capsule Take 1-3 capsules (100-300 mg total) by mouth at bedtime.   ibuprofen (ADVIL,MOTRIN) 200 MG tablet Take 400 mg by mouth every 6 (six) hours as needed for moderate pain.   Multiple Vitamin (MULTIVITAMIN WITH MINERALS) TABS tablet Take 1 tablet by mouth daily.   predniSONE  (DELTASONE ) 50 MG tablet Take 1 pill daily for 5 days   venlafaxine XR (EFFEXOR-XR) 150 MG 24 hr capsule Take 150 mg by  mouth daily with breakfast.    No current facility-administered medications on file prior to visit.     Allergies  Allergen Reactions   Oxycodone Itching   Dilantin [Phenytoin] Rash    Review Of Systems:  Constitutional:   No  weight loss, night sweats,  Fevers, chills, fatigue, or  lassitude.  HEENT:   No headaches,  Difficulty swallowing,  Tooth/dental problems, or  Sore throat,                No sneezing, itching, ear ache, nasal congestion, post nasal drip,   CV:  No chest pain,  Orthopnea, PND, swelling in lower extremities, anasarca, dizziness, palpitations, syncope.   GI  No heartburn, indigestion, abdominal pain, nausea, vomiting, diarrhea, change in bowel habits, loss of appetite, bloody stools.   Resp: No shortness of breath with exertion or at rest.  No excess mucus,  no productive cough,  No non-productive cough,  No coughing up of blood.  No change in color of mucus.  No wheezing.  No chest wall deformity  Skin: no rash or lesions.  GU: no dysuria, change in color of urine, no urgency or frequency.  No flank pain, no hematuria   MS:  No joint pain or swelling.  No decreased range of motion.  No back pain.  Psych:  No change in mood or affect. No depression or anxiety.  No memory loss.   Vital Signs BP 128/80 (BP Location: Left Arm, Patient Position: Sitting, Cuff Size: Large)   Pulse 84   Temp 98.4 F (36.9 C) (Oral)   Ht 5' 4 (1.626 m)   Wt 196 lb 9.6 oz (89.2 kg)   SpO2 99%   BMI 33.75 kg/m    Physical Exam:  General- No distress,  A&Ox3 ENT: No sinus tenderness, TM clear, pale nasal mucosa, no oral exudate,no post nasal drip, no LAN Cardiac: S1, S2, regular rate and rhythm, no murmur Chest: No wheeze/ rales/ dullness; no accessory muscle use, no nasal flaring, no sternal retractions Abd.: Soft Non-tender, ND, BS +, Body mass index is 33.75 kg/m.  Ext: No clubbing cyanosis, edema Neuro:  normal strength, MAE x 4, A&O x 3 Skin: No rashes, warm and  dry, no lesions  Psych: normal mood and behavior   Assessment and Plan Stable Pulmonary Nodule on CT Imaging Never smoker with family history of ovarian and throat cancer Plan We will do a 12 month follow up Ct Chest   This will be due after 01/16/2024 You will get a call closer to the time to get this scheduled.  Please call sooner for unexplained weight loss or blood in your sputum when you cough.  Follow up visit after CT Chest has resulted to review results Call if you need us  sooner Please contact office for sooner follow up if symptoms do not improve or worsen or seek emergency care     I spent 15 minutes dedicated to the care of this patient on the date of this encounter to include pre-visit review of records, face-to-face time with the patient discussing conditions above, post visit ordering of testing, clinical documentation with the electronic health record, making appropriate referrals as documented, and communicating necessary information to the patient's healthcare team.    Lauraine JULIANNA Lites, NP 01/20/2023  9:50 AM

## 2023-01-20 ENCOUNTER — Encounter: Payer: Self-pay | Admitting: Acute Care

## 2023-01-20 ENCOUNTER — Ambulatory Visit: Payer: Medicare Other | Admitting: Acute Care

## 2023-01-20 VITALS — BP 128/80 | HR 84 | Temp 98.4°F | Ht 64.0 in | Wt 196.6 lb

## 2023-01-20 DIAGNOSIS — R911 Solitary pulmonary nodule: Secondary | ICD-10-CM

## 2023-01-20 NOTE — Patient Instructions (Signed)
 It is good to see you today. You CT chest showed a stable lung nodule. This is great news. Plan will be for a 1 year follow up Ct chest to keep an eye on the nodule of concern.  This will be due 12/2023. You will get a call closer to the time to get this scheduled.  Please call sooner for unexplained weight loss or blood in your sputum when you cough.  Please contact office for sooner follow up if symptoms do not improve or worsen or seek emergency care   Call if you need us .  Happy New Year!!

## 2023-02-09 DIAGNOSIS — E1165 Type 2 diabetes mellitus with hyperglycemia: Secondary | ICD-10-CM | POA: Diagnosis not present

## 2023-02-16 DIAGNOSIS — I2510 Atherosclerotic heart disease of native coronary artery without angina pectoris: Secondary | ICD-10-CM | POA: Diagnosis not present

## 2023-02-16 DIAGNOSIS — E1165 Type 2 diabetes mellitus with hyperglycemia: Secondary | ICD-10-CM | POA: Diagnosis not present

## 2023-02-16 DIAGNOSIS — I10 Essential (primary) hypertension: Secondary | ICD-10-CM | POA: Diagnosis not present

## 2023-02-16 DIAGNOSIS — E785 Hyperlipidemia, unspecified: Secondary | ICD-10-CM | POA: Diagnosis not present

## 2023-05-19 DIAGNOSIS — E1165 Type 2 diabetes mellitus with hyperglycemia: Secondary | ICD-10-CM | POA: Diagnosis not present

## 2023-05-26 DIAGNOSIS — I2510 Atherosclerotic heart disease of native coronary artery without angina pectoris: Secondary | ICD-10-CM | POA: Diagnosis not present

## 2023-05-26 DIAGNOSIS — E1165 Type 2 diabetes mellitus with hyperglycemia: Secondary | ICD-10-CM | POA: Diagnosis not present

## 2023-05-26 DIAGNOSIS — I10 Essential (primary) hypertension: Secondary | ICD-10-CM | POA: Diagnosis not present
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
## 2023-08-18 NOTE — Progress Notes (Signed)
 err

## 2023-08-26 DIAGNOSIS — E1165 Type 2 diabetes mellitus with hyperglycemia: Secondary | ICD-10-CM | POA: Diagnosis not present

## 2023-09-02 DIAGNOSIS — I10 Essential (primary) hypertension: Secondary | ICD-10-CM | POA: Diagnosis not present

## 2023-09-02 DIAGNOSIS — I2510 Atherosclerotic heart disease of native coronary artery without angina pectoris: Secondary | ICD-10-CM | POA: Diagnosis not present

## 2023-09-02 DIAGNOSIS — E1165 Type 2 diabetes mellitus with hyperglycemia: Secondary | ICD-10-CM | POA: Diagnosis not present

## 2023-10-13 ENCOUNTER — Emergency Department (HOSPITAL_COMMUNITY)

## 2023-10-13 ENCOUNTER — Emergency Department (HOSPITAL_COMMUNITY)
Admission: EM | Admit: 2023-10-13 | Discharge: 2023-10-13 | Disposition: A | Discharge status: Home / Self Care | Attending: Emergency Medicine | Admitting: Emergency Medicine

## 2023-10-13 ENCOUNTER — Encounter (HOSPITAL_COMMUNITY): Payer: Self-pay | Admitting: Emergency Medicine

## 2023-10-13 DIAGNOSIS — I10 Essential (primary) hypertension: Secondary | ICD-10-CM | POA: Insufficient documentation

## 2023-10-13 DIAGNOSIS — R000 Tachycardia, unspecified: Secondary | ICD-10-CM | POA: Diagnosis not present

## 2023-10-13 DIAGNOSIS — R531 Weakness: Secondary | ICD-10-CM | POA: Diagnosis not present

## 2023-10-13 DIAGNOSIS — Z79899 Other long term (current) drug therapy: Secondary | ICD-10-CM | POA: Insufficient documentation

## 2023-10-13 DIAGNOSIS — I4892 Unspecified atrial flutter: Secondary | ICD-10-CM | POA: Diagnosis not present

## 2023-10-13 DIAGNOSIS — G43109 Migraine with aura, not intractable, without status migrainosus: Secondary | ICD-10-CM

## 2023-10-13 DIAGNOSIS — R200 Anesthesia of skin: Secondary | ICD-10-CM

## 2023-10-13 DIAGNOSIS — R410 Disorientation, unspecified: Secondary | ICD-10-CM | POA: Diagnosis not present

## 2023-10-13 DIAGNOSIS — R29810 Facial weakness: Secondary | ICD-10-CM

## 2023-10-13 DIAGNOSIS — R202 Paresthesia of skin: Secondary | ICD-10-CM | POA: Diagnosis not present

## 2023-10-13 DIAGNOSIS — Z8673 Personal history of transient ischemic attack (TIA), and cerebral infarction without residual deficits: Secondary | ICD-10-CM | POA: Diagnosis not present

## 2023-10-13 DIAGNOSIS — R002 Palpitations: Secondary | ICD-10-CM | POA: Diagnosis not present

## 2023-10-13 DIAGNOSIS — Z7901 Long term (current) use of anticoagulants: Secondary | ICD-10-CM | POA: Insufficient documentation

## 2023-10-13 DIAGNOSIS — E782 Mixed hyperlipidemia: Secondary | ICD-10-CM | POA: Insufficient documentation

## 2023-10-13 LAB — COMPREHENSIVE METABOLIC PANEL WITH GFR
ALT: 20 U/L (ref 0–44)
AST: 24 U/L (ref 15–41)
Albumin: 4.4 g/dL (ref 3.5–5.0)
Alkaline Phosphatase: 78 U/L (ref 38–126)
Anion gap: 15 (ref 5–15)
BUN: 14 mg/dL (ref 8–23)
CO2: 19 mmol/L — ABNORMAL LOW (ref 22–32)
Calcium: 9.5 mg/dL (ref 8.9–10.3)
Chloride: 101 mmol/L (ref 98–111)
Creatinine, Ser: 1.12 mg/dL — ABNORMAL HIGH (ref 0.44–1.00)
GFR, Estimated: 50 mL/min — ABNORMAL LOW (ref >60)
Glucose, Bld: 233 mg/dL — ABNORMAL HIGH (ref 70–99)
Potassium: 3.6 mmol/L (ref 3.5–5.1)
Sodium: 135 mmol/L (ref 135–145)
Total Bilirubin: 1.2 mg/dL (ref 0.0–1.2)
Total Protein: 7.2 g/dL (ref 6.5–8.1)

## 2023-10-13 LAB — CBC
HCT: 43.2 % (ref 36.0–46.0)
Hemoglobin: 13.1 g/dL (ref 12.0–15.0)
MCH: 24 pg — ABNORMAL LOW (ref 26.0–34.0)
MCHC: 30.3 g/dL (ref 30.0–36.0)
MCV: 79.1 fL — ABNORMAL LOW (ref 80.0–100.0)
Platelets: 326 K/uL (ref 150–400)
RBC: 5.46 MIL/uL — ABNORMAL HIGH (ref 3.87–5.11)
RDW: 12.7 % (ref 11.5–15.5)
WBC: 5.4 K/uL (ref 4.0–10.5)
nRBC: 0 % (ref 0.0–0.2)

## 2023-10-13 LAB — DIFFERENTIAL
Abs Immature Granulocytes: 0.02 K/uL (ref 0.00–0.07)
Basophils Absolute: 0.1 K/uL (ref 0.0–0.1)
Basophils Relative: 1 %
Eosinophils Absolute: 0.1 K/uL (ref 0.0–0.5)
Eosinophils Relative: 2 %
Immature Granulocytes: 0 %
Lymphocytes Relative: 37 %
Lymphs Abs: 2 K/uL (ref 0.7–4.0)
Monocytes Absolute: 0.4 K/uL (ref 0.1–1.0)
Monocytes Relative: 8 %
Neutro Abs: 2.8 K/uL (ref 1.7–7.7)
Neutrophils Relative %: 52 %

## 2023-10-13 LAB — TROPONIN I (HIGH SENSITIVITY)
Troponin I (High Sensitivity): 29 ng/L — ABNORMAL HIGH (ref <18)
Troponin I (High Sensitivity): 58 ng/L — ABNORMAL HIGH (ref <18)

## 2023-10-13 LAB — PROTIME-INR
INR: 1 (ref 0.8–1.2)
Prothrombin Time: 13.5 s (ref 11.4–15.2)

## 2023-10-13 LAB — I-STAT CHEM 8, ED
BUN: 18 mg/dL (ref 8–23)
Calcium, Ion: 1.08 mmol/L — ABNORMAL LOW (ref 1.15–1.40)
Chloride: 102 mmol/L (ref 98–111)
Creatinine, Ser: 1 mg/dL (ref 0.44–1.00)
Glucose, Bld: 235 mg/dL — ABNORMAL HIGH (ref 70–99)
HCT: 41 % (ref 36.0–46.0)
Hemoglobin: 13.9 g/dL (ref 12.0–15.0)
Potassium: 3.8 mmol/L (ref 3.5–5.1)
Sodium: 135 mmol/L (ref 135–145)
TCO2: 21 mmol/L — ABNORMAL LOW (ref 22–32)

## 2023-10-13 LAB — APTT: aPTT: 27 s (ref 24–36)

## 2023-10-13 LAB — CBG MONITORING, ED: Glucose-Capillary: 220 mg/dL — ABNORMAL HIGH (ref 70–99)

## 2023-10-13 LAB — ETHANOL: Alcohol, Ethyl (B): <15 mg/dL (ref <15)

## 2023-10-13 LAB — MAGNESIUM: Magnesium: 1.9 mg/dL (ref 1.7–2.4)

## 2023-10-13 MED ORDER — IOHEXOL 350 MG/ML SOLN
75.0000 mL | Freq: Once | INTRAVENOUS | Status: AC | PRN
  Administered 2023-10-13: 75 mL via INTRAVENOUS

## 2023-10-13 MED ORDER — APIXABAN 5 MG PO TABS
5.0000 mg | ORAL_TABLET | Freq: Once | ORAL | Status: AC
  Administered 2023-10-13: 5 mg via ORAL
  Filled 2023-10-13: qty 1

## 2023-10-13 MED ORDER — DILTIAZEM HCL 25 MG/5ML IV SOLN
20.0000 mg | Freq: Once | INTRAVENOUS | Status: AC
  Administered 2023-10-13: 20 mg via INTRAVENOUS
  Filled 2023-10-13: qty 5

## 2023-10-13 MED ORDER — METOPROLOL SUCCINATE ER 25 MG PO TB24: 25.0000 mg | ORAL_TABLET | Freq: Every day | ORAL | 0 refills | Status: DC

## 2023-10-13 MED ORDER — APIXABAN 5 MG PO TABS: 5.0000 mg | ORAL_TABLET | Freq: Two times a day (BID) | ORAL | 1 refills | Status: DC

## 2023-10-13 MED ORDER — APIXABAN 5 MG PO TABS: 5.0000 mg | ORAL_TABLET | Freq: Two times a day (BID) | ORAL | Status: DC

## 2023-10-13 MED ORDER — METOPROLOL SUCCINATE ER 25 MG PO TB24: 25.0000 mg | ORAL_TABLET | Freq: Every day | ORAL | Status: DC

## 2023-10-13 MED ORDER — METOPROLOL SUCCINATE ER 25 MG PO TB24
25.0000 mg | ORAL_TABLET | Freq: Every day | ORAL | Status: DC
  Administered 2023-10-13: 25 mg via ORAL
  Filled 2023-10-13: qty 1

## 2023-10-13 NOTE — Discharge Instructions (Addendum)
 Thank you for coming to [facility] Emergency Department. You were seen for heart fluttering sensation and left-sided numbness. We did an exam, labs, and imaging, and these showed atrial flutter.  We have referred you to the A-fib clinic with cardiology, and they will call you in the next couple of days to make an appointment.  Please take metoprolol  succinate (Toprol -XL) 25 mg once per day as well as apixaban  (Eliquis ) 5 mg twice per day.  You can stop taking aspirin daily.  Please follow-up with a cardiologist within 1 week.  Your blood sugar was also mildly high.  You may need to follow this up with your primary care physician.  Please follow up with your primary care provider within 1 week.   Do not hesitate to return to the ED or call 911 if you experience: -Worsening symptoms -Chest pain -Asymmetric numbness/tingling, weakness, changes to vision, slurred speech, facial droop, confusion -Falls, head trauma -Lightheadedness, passing out -Fevers/chills -Anything else that concerns you

## 2023-10-13 NOTE — ED Triage Notes (Signed)
 Pt here as a code stroke with c/o left arm numbness and some gait problems according to family , pt lkw  [id number],

## 2023-10-13 NOTE — Progress Notes (Signed)
   Called from the ER regarding Sherri Torres who originally presented with symptoms concerning for code stroke however she was noted to be in atrial flutter.  She was given IV diltiazem  with conversion to sinus rhythm.  She has had several episodes of this where she gets rapid palpitations and feels weak and dizzy and reported left-sided numbness.  She was evaluated by neurology and thought not to have stroke.  CT head showed no acute abnormality.  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  These are apparently stable compared to 2007 imaging.  I personally reviewed the EKG which shows a narrow complex regular tachycardia in the 140s concerning for possible atrial flutter versus atrial tachycardia however the features are more concerning for flutter.  I am also concerned about her presentation for possible TIA.  Based on this I would recommend anticoagulation.  Given age weight and renal function she would be a candidate for 5 mg twice daily Eliquis .  Advised stopping aspirin.  Would also start some rate control agent.  Recommended Toprol -XL 25 mg daily.  She is already on amlodipine and other blood pressure medications.  Would arrange for follow-up in the A-fib clinic. Case discussed with the ER physician Dr. Franklyn.  Vinie KYM Maxcy, MD, [facility], FNLA, FACP  [place]  [facility]  Medical Director of the [facility]  Cardiovascular Risk Reduction Clinic Diplomate of the American Board of Clinical Lipidology Attending Cardiologist  Direct Dial: [phone]  Fax: [phone]  Website:  [email or web address]
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
## 2023-10-13 NOTE — ED Notes (Signed)
 Patient ambulated to bathroom.
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
## 2023-10-21 ENCOUNTER — Ambulatory Visit (HOSPITAL_COMMUNITY)
Admission: RE | Admit: 2023-10-21 | Discharge: 2023-10-21 | Disposition: A | Discharge status: Home / Self Care | Source: Ambulatory Visit | Attending: Internal Medicine | Admitting: Internal Medicine

## 2023-10-21 ENCOUNTER — Inpatient Hospital Stay (HOSPITAL_COMMUNITY)
Admission: RE | Admit: 2023-10-21 | Discharge: 2023-10-21 | Disposition: A | Source: Ambulatory Visit | Attending: Internal Medicine | Admitting: Internal Medicine

## 2023-10-21 ENCOUNTER — Encounter (HOSPITAL_COMMUNITY): Payer: Self-pay | Admitting: Internal Medicine

## 2023-10-21 ENCOUNTER — Telehealth (HOSPITAL_COMMUNITY): Payer: Self-pay | Admitting: Pharmacy Technician

## 2023-10-21 ENCOUNTER — Other Ambulatory Visit (HOSPITAL_COMMUNITY): Payer: Self-pay

## 2023-10-21 VITALS — BP 160/90 | HR 67 | Ht 64.0 in | Wt 183.8 lb

## 2023-10-21 DIAGNOSIS — D6869 Other thrombophilia: Secondary | ICD-10-CM | POA: Insufficient documentation

## 2023-10-21 DIAGNOSIS — I4891 Unspecified atrial fibrillation: Secondary | ICD-10-CM | POA: Diagnosis not present

## 2023-10-21 DIAGNOSIS — I484 Atypical atrial flutter: Secondary | ICD-10-CM | POA: Insufficient documentation

## 2023-10-21 DIAGNOSIS — I4892 Unspecified atrial flutter: Secondary | ICD-10-CM | POA: Insufficient documentation

## 2023-10-21 MED ORDER — APIXABAN 5 MG PO TABS: 5.0000 mg | ORAL_TABLET | Freq: Two times a day (BID) | ORAL | 6 refills | Status: AC

## 2023-10-21 MED ORDER — METOPROLOL SUCCINATE ER 25 MG PO TB24: 25.0000 mg | ORAL_TABLET | Freq: Every day | ORAL | 2 refills | Status: AC
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
## 2023-10-21 NOTE — Telephone Encounter (Signed)
 Patient Product/process development scientist completed.    The patient is insured through [employer]. Patient has Medicare and is not eligible for a copay card, but may be able to apply for patient assistance or Medicare RX Payment Plan (Patient Must reach out to their plan, if eligible for payment plan), if available.    Ran test claim for Eliquis 5 mg and the current 30 day co-pay is $35.00.   This test claim was processed through [facility]- copay amounts may vary at other pharmacies due to pharmacy/plan contracts, or as the patient moves through the different stages of their insurance plan.     Roland Earl, CPHT Pharmacy Technician III Certified Patient Advocate [facility] Patient Advocate Team Direct Number: [phone]  Fax: [phone]

## 2023-10-21 NOTE — Patient Instructions (Addendum)
 Wear monitor for 14 days remove 11/04/23 place in box and send in the mail. We will call you for follow up appointment   Echocardiogram -- scheduling will call once insurance authorization received.   Get labwork in one month 11/21/23 at any [facility] You may go to any [facility] Location for your lab work:  [facility] - [address] ([facility]) - [address] [phone] [address]   If you have any lab test that is abnormal or we need to change your treatment, we will call you

## 2023-10-23 ENCOUNTER — Other Ambulatory Visit (HOSPITAL_COMMUNITY): Payer: Self-pay

## 2023-10-23 DIAGNOSIS — D6869 Other thrombophilia: Secondary | ICD-10-CM

## 2023-10-23 DIAGNOSIS — I4892 Unspecified atrial flutter: Secondary | ICD-10-CM

## 2023-11-02 DIAGNOSIS — I10 Essential (primary) hypertension: Secondary | ICD-10-CM | POA: Diagnosis not present

## 2023-11-02 DIAGNOSIS — E785 Hyperlipidemia, unspecified: Secondary | ICD-10-CM | POA: Diagnosis not present

## 2023-11-06 DIAGNOSIS — E1129 Type 2 diabetes mellitus with other diabetic kidney complication: Secondary | ICD-10-CM | POA: Diagnosis not present

## 2023-11-06 DIAGNOSIS — I2510 Atherosclerotic heart disease of native coronary artery without angina pectoris: Secondary | ICD-10-CM | POA: Diagnosis not present

## 2023-11-06 DIAGNOSIS — I10 Essential (primary) hypertension: Secondary | ICD-10-CM | POA: Diagnosis not present

## 2023-11-06 DIAGNOSIS — R809 Proteinuria, unspecified: Secondary | ICD-10-CM | POA: Diagnosis not present

## 2023-11-06 DIAGNOSIS — F419 Anxiety disorder, unspecified: Secondary | ICD-10-CM | POA: Diagnosis not present

## 2023-11-06 DIAGNOSIS — Z0000 Encounter for general adult medical examination without abnormal findings: Secondary | ICD-10-CM | POA: Diagnosis not present

## 2023-11-06 DIAGNOSIS — R911 Solitary pulmonary nodule: Secondary | ICD-10-CM | POA: Diagnosis not present

## 2023-11-06 DIAGNOSIS — M8580 Other specified disorders of bone density and structure, unspecified site: Secondary | ICD-10-CM | POA: Diagnosis not present

## 2023-11-06 DIAGNOSIS — I480 Paroxysmal atrial fibrillation: Secondary | ICD-10-CM | POA: Diagnosis not present

## 2023-11-06 DIAGNOSIS — D6869 Other thrombophilia: Secondary | ICD-10-CM | POA: Diagnosis not present

## 2023-11-06 DIAGNOSIS — I700 Atherosclerosis of aorta: Secondary | ICD-10-CM | POA: Diagnosis not present

## 2023-11-11 DIAGNOSIS — I4891 Unspecified atrial fibrillation: Secondary | ICD-10-CM | POA: Diagnosis not present

## 2023-11-12 ENCOUNTER — Ambulatory Visit (HOSPITAL_COMMUNITY): Payer: Self-pay | Admitting: Internal Medicine

## 2023-11-23 DIAGNOSIS — I4891 Unspecified atrial fibrillation: Secondary | ICD-10-CM | POA: Diagnosis not present

## 2023-11-24 LAB — CBC
Hematocrit: 41.9 % (ref 34.0–46.6)
Hemoglobin: 12.8 g/dL (ref 11.1–15.9)
MCH: 24.8 pg — ABNORMAL LOW (ref 26.6–33.0)
MCHC: 30.5 g/dL — ABNORMAL LOW (ref 31.5–35.7)
MCV: 81 fL (ref 79–97)
Platelets: 326 x10E3/uL (ref 150–450)
RBC: 5.17 x10E6/uL (ref 3.77–5.28)
RDW: 12.8 % (ref 11.7–15.4)
WBC: 5.9 x10E3/uL (ref 3.4–10.8)

## 2023-11-27 ENCOUNTER — Ambulatory Visit (HOSPITAL_COMMUNITY): Payer: Self-pay | Admitting: Internal Medicine

## 2023-11-27 ENCOUNTER — Ambulatory Visit (HOSPITAL_COMMUNITY)
Admission: RE | Admit: 2023-11-27 | Discharge: 2023-11-27 | Disposition: A | Discharge status: Home / Self Care | Source: Ambulatory Visit | Attending: Internal Medicine | Admitting: Internal Medicine

## 2023-11-27 DIAGNOSIS — I3481 Nonrheumatic mitral (valve) annulus calcification: Secondary | ICD-10-CM | POA: Diagnosis not present

## 2023-11-27 DIAGNOSIS — I358 Other nonrheumatic aortic valve disorders: Secondary | ICD-10-CM | POA: Diagnosis not present

## 2023-11-27 DIAGNOSIS — I119 Hypertensive heart disease without heart failure: Secondary | ICD-10-CM | POA: Insufficient documentation

## 2023-11-27 DIAGNOSIS — I4891 Unspecified atrial fibrillation: Secondary | ICD-10-CM | POA: Diagnosis not present

## 2023-11-27 DIAGNOSIS — D6869 Other thrombophilia: Secondary | ICD-10-CM | POA: Diagnosis not present

## 2023-11-27 DIAGNOSIS — I4892 Unspecified atrial flutter: Secondary | ICD-10-CM | POA: Diagnosis not present

## 2023-11-27 DIAGNOSIS — I484 Atypical atrial flutter: Secondary | ICD-10-CM | POA: Diagnosis not present

## 2023-11-27 LAB — ECHOCARDIOGRAM COMPLETE
Area-P 1/2: 2.33 cm2
S' Lateral: 2.6 cm

## 2023-11-30 DIAGNOSIS — H538 Other visual disturbances: Secondary | ICD-10-CM | POA: Diagnosis not present

## 2023-12-08 DIAGNOSIS — H25813 Combined forms of age-related cataract, bilateral: Secondary | ICD-10-CM | POA: Diagnosis not present

## 2023-12-08 DIAGNOSIS — E119 Type 2 diabetes mellitus without complications: Secondary | ICD-10-CM | POA: Diagnosis not present

## 2023-12-15 DIAGNOSIS — I700 Atherosclerosis of aorta: Secondary | ICD-10-CM | POA: Diagnosis not present

## 2024-01-06 ENCOUNTER — Inpatient Hospital Stay: Admission: RE | Admit: 2024-01-06 | Discharge: 2024-01-06 | Attending: Acute Care | Admitting: Acute Care

## 2024-01-06 DIAGNOSIS — R911 Solitary pulmonary nodule: Secondary | ICD-10-CM

## 2024-01-22 ENCOUNTER — Encounter: Payer: Self-pay | Admitting: Acute Care

## 2024-01-22 ENCOUNTER — Ambulatory Visit: Admitting: Acute Care

## 2024-01-22 VITALS — BP 146/77 | HR 81 | Temp 98.6°F | Ht 63.0 in | Wt 185.0 lb

## 2024-01-22 DIAGNOSIS — R911 Solitary pulmonary nodule: Secondary | ICD-10-CM | POA: Diagnosis not present

## 2024-01-22 DIAGNOSIS — Z809 Family history of malignant neoplasm, unspecified: Secondary | ICD-10-CM

## 2024-01-22 DIAGNOSIS — Z789 Other specified health status: Secondary | ICD-10-CM

## 2024-01-22 DIAGNOSIS — R9389 Abnormal findings on diagnostic imaging of other specified body structures: Secondary | ICD-10-CM

## 2024-01-22 NOTE — Patient Instructions (Signed)
 It is good to see you today. We have reviewed your scan. This shows the left lower lobe 6 mm nodule we have been monitoring is stable in size. This is reassuring. We will do a follow up CT chest in 1 year, due 12/2024 to maintain surveillance . You will get a call to get this scheduled closer to the time. Call to be seen sooner for any unexplained weight loss or blood in your sputum when you cough. See you in a year. Please contact office for sooner follow up if symptoms do not improve or worsen or seek emergency care

## 2024-01-22 NOTE — Progress Notes (Signed)
 "  History of Present Illness Sherri Torres is a 79 y.o. female with past medical history of stroke, hypertension, depression. Patient is a lifelong non-smoker.She had a cardiac scoring CT completed in November 2023. Patient was referred for evaluation after a 6 mm nodule was found in the left lower lobe of the lung. Mother has a history of ovarian cancer, father with a history of throat cancer. She was followed by Dr. Brenna .   01/22/2024 Discussed the use of AI scribe software for clinical note transcription with the patient, who gave verbal consent to proceed.  History of Present Illness Sherri Torres is a 79 year old female never smoker , however with a significant family history of cancer,  who presents for follow-up imaging of a left lower pulmonary nodule that has been under surveillance.  She was originally referred to see Dr. Brenna aftwer an abnormal Calcium scoring cardiac scan 11/2021.  She has a known six millimeter nodule in the left lower lobe of the lung, which is under surveillance.We have reviewed her most recent 12/2023 CT Chest, which shows ongoing stability of the 6 mm left lower lobe pulmonary nodule.   No unexplained weight loss, hemoptysis, or other respiratory issues. She recently received her RSV vaccination.  I have asked her to call to be seen sooner if she has any unexplained weight loss , or blood in her sputum when she coughs. She verbalized understanding.  BP was slightly elevated today on first check. It was 151/83. Pt. States she took her BP medication this morning, but she was very anxious about the results of her scan. Upon recheck the BP was 146/77. I have asked her to check BP  again at home and if it remains elevated she needs to follow up with her PCP. She verbalized understanding.     Test Results: 01/06/2024 Ongoing stability of a 6 mm left lower lobe pulmonary nodule, consistent with a benign etiology. No specific follow up indicated. 2. No acute  process in the chest. 3. Aortic atherosclerosis (ICD10-I70.0).   01/16/2023 CT Chest  Lungs/Pleura: No pleural effusion. No pneumothorax. 6 mm pulmonary nodule, lateral left lower lobe (Im92,Se4) previously 7 mm. Lungs otherwise clear.   Upper Abdomen: Cholecystectomy clips. Aortic Atherosclerosis (ICD10-170.0). No acute findings.  07/01/2022 CT Chest Mild increase in size of the previously demonstrated 6 x 5 mm left lower lobe nodule, currently measuring 7 x 7 mm. Recommend a follow-up chest CT without contrast in 6 months. 2. No new nodules seen.  12/05/2021 Calcium Scoring Scan  6 mm nodule seen in left lower lobe. Non-contrast chest CT at 6-12 months is recommended. If the nodule is stable at time of repeat CT, then future CT at 18-24 months   Musculoskeletal: Vertebral endplate spurring at multiple levels in the mid and lower thoracic spine.   IMPRESSION: 1. 6 mm left lower lobe pulmonary nodule, previously 7 mm. Non-contrast chest CT 12 months (from today's scan) is considered optional for low-risk patients, but is recommended for high-risk patients. This recommendation follows the consensus statement: Guidelines for Management of Incidental Pulmonary Nodules Detected on CT Images: From the Fleischner Society 2017; Radiology 2017; 284:228-243. 2.  Aortic Atherosclerosis (ICD10-I70.0).    Latest Ref Rng & Units 11/23/2023   10:45 AM 10/13/2023    4:03 PM 10/13/2023    3:58 PM  CBC  WBC 3.4 - 10.8 x10E3/uL 5.9   5.4   Hemoglobin 11.1 - 15.9 g/dL 87.1  86.0  86.8  Hematocrit 34.0 - 46.6 % 41.9  41.0  43.2   Platelets 150 - 450 x10E3/uL 326   326        Latest Ref Rng & Units 10/13/2023    4:03 PM 10/13/2023    3:58 PM 09/14/2013    5:51 PM  BMP  Glucose 70 - 99 mg/dL 764  766  892   BUN 8 - 23 mg/dL 18  14  17    Creatinine 0.44 - 1.00 mg/dL 8.99  8.87  9.26   Sodium 135 - 145 mmol/L 135  135  137   Potassium 3.5 - 5.1 mmol/L 3.8  3.6  4.1   Chloride 98 - 111  mmol/L 102  101  99   CO2 22 - 32 mmol/L  19  24   Calcium 8.9 - 10.3 mg/dL  9.5  9.5     BNP No results found for: BNP  ProBNP No results found for: PROBNP  PFT No results found for: FEV1PRE, FEV1POST, FVCPRE, FVCPOST, TLC, DLCOUNC, PREFEV1FVCRT, PSTFEV1FVCRT  CT CHEST WO CONTRAST Result Date: 01/09/2024 EXAM: CT CHEST WITHOUT CONTRAST 01/06/2024 10:16:38 AM TECHNIQUE: CT of the chest was performed without the administration of intravenous contrast. Multiplanar reformatted images are provided for review. Automated exposure control, iterative reconstruction, and/or weight based adjustment of the mA/kV was utilized to reduce the radiation dose to as low as reasonably achievable. COMPARISON: 01/07/2023 CLINICAL HISTORY: Lung nodule, > 8mm. FINDINGS: MEDIASTINUM: No mediastinal or hilar adenopathy. Hilar regions poorly evaluated without intravenous contrast. The central airways are clear. No axillary lymphadenopathy. CARDIOVASCULAR: Normal heart size, without pericardial effusion. Normal aortic caliber. Aortic atherosclerosis. LUNGS AND PLEURA: Left lower lobe pulmonary nodule measures 6 x 5 mm on image 95 / 8 and is similar to on the prior exam. No pleural effusion or pneumothorax. SOFT TISSUES/BONES: Mid thoracic spondylosis. No acute abnormality of the soft tissues. UPPER ABDOMEN: Limited images of the upper abdomen demonstrates no acute abnormality. IMPRESSION: 1. Ongoing stability of a 6 mm left lower lobe pulmonary nodule, consistent with a benign etiology. No specific follow up indicated. 2. No acute process in the chest. 3. Aortic atherosclerosis (ICD10-I70.0). Electronically signed by: Rockey Kilts MD 01/09/2024 01:28 PM EST RP Workstation: [id number]     Past medical hx Past Medical History:  Diagnosis Date   Depression    Hypertension    Nosebleed    Stroke [facility])      Social History[1]  Ms.Bieler reports that she has never smoked. She has never used smokeless  tobacco. She reports that she does not drink alcohol and does not use drugs.  Tobacco Cessation: Counseling given: Not Answered Tobacco comments: Never smoked 10/21/23   Past surgical hx, Family hx, Social hx all reviewed.  Current Outpatient Medications on File Prior to Visit  Medication Sig   amLODipine (NORVASC) 5 MG tablet Take 5 mg by mouth daily.   apixaban  (ELIQUIS ) 5 MG TABS tablet Take 1 tablet (5 mg total) by mouth 2 (two) times daily.   atorvastatin (LIPITOR) 20 MG tablet Take 20 mg by mouth at bedtime.   Calcium Citrate-Vitamin D (CALCITRATE/VITAMIN D PO) Take 1 tablet by mouth daily.   Cholecalciferol (VITAMIN D) 2000 UNITS CAPS Take 2,000 Units by mouth daily.   diphenhydrAMINE HCl, Sleep, (ZZZQUIL PO) Take 0.5 Doses by mouth at bedtime.   FARXIGA 5 MG TABS tablet Take 5 mg by mouth daily.   ibuprofen (ADVIL,MOTRIN) 200 MG tablet Take 400 mg by mouth every 6 (six) hours  as needed for moderate pain.   irbesartan (AVAPRO) 300 MG tablet Take 300 mg by mouth daily.   metFORMIN (GLUCOPHAGE-XR) 500 MG 24 hr tablet Take 500 mg by mouth every evening.   metoprolol  succinate (TOPROL -XL) 25 MG 24 hr tablet Take 1 tablet (25 mg total) by mouth daily.   Multiple Vitamin (MULTIVITAMIN WITH MINERALS) TABS tablet Take 1 tablet by mouth daily.   venlafaxine XR (EFFEXOR-XR) 150 MG 24 hr capsule Take 150 mg by mouth daily with breakfast.    No current facility-administered medications on file prior to visit.     Allergies[2]  Review Of Systems:  Constitutional:   No  weight loss, night sweats,  Fevers, chills, fatigue, or  lassitude.  HEENT:   No headaches,  Difficulty swallowing,  Tooth/dental problems, or  Sore throat,                No sneezing, itching, ear ache, nasal congestion, post nasal drip,   CV:  No chest pain,  Orthopnea, PND, swelling in lower extremities, anasarca, dizziness, palpitations, syncope.   GI  No heartburn, indigestion, abdominal pain, nausea, vomiting,  diarrhea, change in bowel habits, loss of appetite, bloody stools.   Resp: No shortness of breath with exertion or at rest.  No excess mucus, no productive cough,  No non-productive cough,  No coughing up of blood.  No change in color of mucus.  No wheezing.  No chest wall deformity  Skin: no rash or lesions.  GU: no dysuria, change in color of urine, no urgency or frequency.  No flank pain, no hematuria   MS:  No joint pain or swelling.  No decreased range of motion.  No back pain.  Psych:  No change in mood or affect. No depression or anxiety.  No memory loss.   Vital Signs BP (!) 146/77   Pulse 81   Temp 98.6 F (37 C) (Oral)   Ht 5' 3 (1.6 m)   Wt 185 lb (83.9 kg)   SpO2 97%   BMI 32.77 kg/m    Physical Exam: Physical Exam GENERAL: No distress, alert and oriented times 3. EARS NOSE THROAT: No sinus tenderness, tympanic membranes clear, pale nasal mucosa, no oral exudate, no post nasal drip, no lymphadenopathy. CHEST: No wheeze, rales, dullness, no accessory muscle use, no nasal flaring, no sternal retractions. CARDIAC: S1, S2, regular rate and rhythm, no murmur. ABDOMINAL: Soft, non tender. ND, BS present, EXTREMITIES: No clubbing, cyanosis, edema. No obvious deformities NEUROLOGICAL: Normal strength. Alert and oriented x 3, MAE x 4 SKIN: No rashes, warm and dry. No obvious skin lesions PSYCHIATRIC: Normal mood and behavior.   Assessment/Plan  Assessment & Plan Stable left lower lobe pulmonary nodule Never smoker , significant family history of cancer 6 mm nodule stable, likely benign.  Requires 2-year stability for benign confirmation, but with patient family history, she prefers continued monitoring. - Ordered follow-up CT chest in December 2026. - Instructed to report unexplained weight loss or hemoptysis immediately. - Schedule follow-up appointment 1-2 weeks post-CT scan.  Elevated BP in office today 151/83, then 146/77 on recheck. Pt confirms she took her  medication this morning. Plan Recheck BP at home If remains greater than 140 systolic, please follow up with PCP   I spent 15 minutes dedicated to the care of this patient on the date of this encounter to include pre-visit review of records, face-to-face time with the patient discussing conditions above, post visit ordering of testing, clinical documentation with the  electronic health record, making appropriate referrals as documented, and communicating necessary information to the patient's healthcare team.    AVS 01/22/2024 We have reviewed your scan. This shows the left lower lobe 6 mm nodule we have been monitoring is stable in size. This is reassuring. We will do a follow up CT chest in 1 year, due 12/2024 to maintain surveillance . You will get a call to get this scheduled closer to the time. Call to be seen sooner for any unexplained weight loss or blood in your sputum when you cough. See you in a year. Please contact office for sooner follow up if symptoms do not improve or worsen or seek emergency care     Sherri JULIANNA Lites, NP 01/22/2024  9:43 AM             [1]  Social History Tobacco Use   Smoking status: Never   Smokeless tobacco: Never   Tobacco comments:    Never smoked 10/21/23  Vaping Use   Vaping status: Never Used  Substance Use Topics   Alcohol use: No   Drug use: No  [2]  Allergies Allergen Reactions   Oxycodone Itching   Dilantin [Phenytoin] Rash   "

## 2024-05-17 ENCOUNTER — Ambulatory Visit (HOSPITAL_COMMUNITY): Admitting: Internal Medicine

## 2025-01-16 ENCOUNTER — Other Ambulatory Visit
